# Patient Record
Sex: Female | Born: 1955 | Race: Black or African American | Hispanic: No | Marital: Single
Health system: Southern US, Community
[De-identification: ages and names within clinical notes are randomized; demographics above are authoritative.]

---

## 1998-01-08 ENCOUNTER — Ambulatory Visit (HOSPITAL_COMMUNITY): Admission: RE | Admit: 1998-01-08 | Discharge: 1998-01-08 | Payer: Self-pay | Admitting: Urology

## 1998-04-05 ENCOUNTER — Ambulatory Visit (HOSPITAL_COMMUNITY): Admission: RE | Admit: 1998-04-05 | Discharge: 1998-04-05 | Payer: Self-pay | Admitting: Urology

## 1998-04-05 ENCOUNTER — Encounter: Payer: Self-pay | Admitting: Urology

## 1999-01-08 ENCOUNTER — Other Ambulatory Visit: Admission: RE | Admit: 1999-01-08 | Discharge: 1999-01-08 | Payer: Self-pay | Admitting: *Deleted

## 1999-07-22 ENCOUNTER — Encounter: Payer: Self-pay | Admitting: Urology

## 1999-07-22 ENCOUNTER — Encounter: Admission: RE | Admit: 1999-07-22 | Discharge: 1999-07-22 | Payer: Self-pay | Admitting: Urology

## 1999-07-29 ENCOUNTER — Encounter: Payer: Self-pay | Admitting: Urology

## 1999-07-29 ENCOUNTER — Ambulatory Visit (HOSPITAL_COMMUNITY): Admission: RE | Admit: 1999-07-29 | Discharge: 1999-07-29 | Payer: Self-pay | Admitting: Urology

## 1999-11-08 ENCOUNTER — Ambulatory Visit (HOSPITAL_BASED_OUTPATIENT_CLINIC_OR_DEPARTMENT_OTHER): Admission: RE | Admit: 1999-11-08 | Discharge: 1999-11-08 | Payer: Self-pay | Admitting: Orthopedic Surgery

## 1999-11-08 ENCOUNTER — Encounter (INDEPENDENT_AMBULATORY_CARE_PROVIDER_SITE_OTHER): Payer: Self-pay | Admitting: *Deleted

## 2000-03-16 ENCOUNTER — Encounter: Admission: RE | Admit: 2000-03-16 | Discharge: 2000-03-16 | Payer: Self-pay | Admitting: Family Medicine

## 2000-03-16 ENCOUNTER — Encounter: Payer: Self-pay | Admitting: Family Medicine

## 2000-07-02 ENCOUNTER — Encounter: Payer: Self-pay | Admitting: Internal Medicine

## 2000-07-02 ENCOUNTER — Emergency Department (HOSPITAL_COMMUNITY): Admission: EM | Admit: 2000-07-02 | Discharge: 2000-07-03 | Payer: Self-pay | Admitting: Emergency Medicine

## 2001-04-12 ENCOUNTER — Encounter: Payer: Self-pay | Admitting: Family Medicine

## 2001-04-12 ENCOUNTER — Encounter: Admission: RE | Admit: 2001-04-12 | Discharge: 2001-04-12 | Payer: Self-pay | Admitting: Family Medicine

## 2002-04-18 ENCOUNTER — Encounter: Admission: RE | Admit: 2002-04-18 | Discharge: 2002-04-18 | Payer: Self-pay

## 2002-04-18 ENCOUNTER — Encounter: Payer: Self-pay | Admitting: Family Medicine

## 2002-11-01 ENCOUNTER — Encounter: Payer: Self-pay | Admitting: Family Medicine

## 2002-11-01 ENCOUNTER — Encounter: Admission: RE | Admit: 2002-11-01 | Discharge: 2002-11-01 | Payer: Self-pay | Admitting: Family Medicine

## 2002-11-30 ENCOUNTER — Emergency Department (HOSPITAL_COMMUNITY): Admission: EM | Admit: 2002-11-30 | Discharge: 2002-11-30 | Payer: Self-pay | Admitting: Emergency Medicine

## 2002-11-30 ENCOUNTER — Encounter: Payer: Self-pay | Admitting: Emergency Medicine

## 2003-04-25 ENCOUNTER — Ambulatory Visit (HOSPITAL_COMMUNITY): Admission: RE | Admit: 2003-04-25 | Discharge: 2003-04-25 | Payer: Self-pay | Admitting: Family Medicine

## 2003-07-12 ENCOUNTER — Emergency Department (HOSPITAL_COMMUNITY): Admission: EM | Admit: 2003-07-12 | Discharge: 2003-07-13 | Payer: Self-pay | Admitting: Emergency Medicine

## 2004-03-04 ENCOUNTER — Other Ambulatory Visit: Admission: RE | Admit: 2004-03-04 | Discharge: 2004-03-04 | Payer: Self-pay | Admitting: Family Medicine

## 2004-05-23 ENCOUNTER — Encounter: Admission: RE | Admit: 2004-05-23 | Discharge: 2004-05-23 | Payer: Self-pay | Admitting: Family Medicine

## 2005-03-13 ENCOUNTER — Encounter (INDEPENDENT_AMBULATORY_CARE_PROVIDER_SITE_OTHER): Payer: Self-pay | Admitting: *Deleted

## 2005-03-13 ENCOUNTER — Ambulatory Visit (HOSPITAL_BASED_OUTPATIENT_CLINIC_OR_DEPARTMENT_OTHER): Admission: RE | Admit: 2005-03-13 | Discharge: 2005-03-13 | Payer: Self-pay | Admitting: Obstetrics and Gynecology

## 2005-03-13 ENCOUNTER — Ambulatory Visit (HOSPITAL_COMMUNITY): Admission: RE | Admit: 2005-03-13 | Discharge: 2005-03-13 | Payer: Self-pay | Admitting: Obstetrics and Gynecology

## 2005-06-03 ENCOUNTER — Encounter: Admission: RE | Admit: 2005-06-03 | Discharge: 2005-06-03 | Payer: Self-pay | Admitting: Family Medicine

## 2005-11-05 ENCOUNTER — Other Ambulatory Visit: Admission: RE | Admit: 2005-11-05 | Discharge: 2005-11-05 | Payer: Self-pay | Admitting: Obstetrics and Gynecology

## 2006-06-22 ENCOUNTER — Encounter: Admission: RE | Admit: 2006-06-22 | Discharge: 2006-06-22 | Payer: Self-pay | Admitting: *Deleted

## 2006-11-02 ENCOUNTER — Other Ambulatory Visit: Admission: RE | Admit: 2006-11-02 | Discharge: 2006-11-02 | Payer: Self-pay | Admitting: Family Medicine

## 2007-06-28 ENCOUNTER — Encounter: Admission: RE | Admit: 2007-06-28 | Discharge: 2007-06-28 | Payer: Self-pay | Admitting: Family Medicine

## 2008-02-01 ENCOUNTER — Emergency Department (HOSPITAL_BASED_OUTPATIENT_CLINIC_OR_DEPARTMENT_OTHER): Admission: EM | Admit: 2008-02-01 | Discharge: 2008-02-01 | Payer: Self-pay | Admitting: Emergency Medicine

## 2008-02-02 ENCOUNTER — Ambulatory Visit (HOSPITAL_BASED_OUTPATIENT_CLINIC_OR_DEPARTMENT_OTHER): Admission: RE | Admit: 2008-02-02 | Discharge: 2008-02-02 | Payer: Self-pay | Admitting: Emergency Medicine

## 2008-04-07 ENCOUNTER — Encounter (INDEPENDENT_AMBULATORY_CARE_PROVIDER_SITE_OTHER): Payer: Self-pay | Admitting: Orthopedic Surgery

## 2008-04-07 ENCOUNTER — Ambulatory Visit (HOSPITAL_BASED_OUTPATIENT_CLINIC_OR_DEPARTMENT_OTHER): Admission: RE | Admit: 2008-04-07 | Discharge: 2008-04-07 | Payer: Self-pay | Admitting: Orthopedic Surgery

## 2008-07-03 ENCOUNTER — Other Ambulatory Visit: Admission: RE | Admit: 2008-07-03 | Discharge: 2008-07-03 | Payer: Self-pay | Admitting: Family Medicine

## 2008-07-17 ENCOUNTER — Encounter: Admission: RE | Admit: 2008-07-17 | Discharge: 2008-07-17 | Payer: Self-pay | Admitting: Family Medicine

## 2009-01-27 ENCOUNTER — Ambulatory Visit: Payer: Self-pay | Admitting: Radiology

## 2009-01-27 ENCOUNTER — Emergency Department (HOSPITAL_BASED_OUTPATIENT_CLINIC_OR_DEPARTMENT_OTHER): Admission: EM | Admit: 2009-01-27 | Discharge: 2009-01-27 | Payer: Self-pay | Admitting: Emergency Medicine

## 2009-07-01 ENCOUNTER — Emergency Department (HOSPITAL_COMMUNITY): Admission: EM | Admit: 2009-07-01 | Discharge: 2009-07-01 | Payer: Self-pay | Admitting: Family Medicine

## 2009-07-01 ENCOUNTER — Emergency Department (HOSPITAL_COMMUNITY): Admission: EM | Admit: 2009-07-01 | Discharge: 2009-07-02 | Payer: Self-pay | Admitting: Emergency Medicine

## 2009-07-09 ENCOUNTER — Other Ambulatory Visit: Admission: RE | Admit: 2009-07-09 | Discharge: 2009-07-09 | Payer: Self-pay | Admitting: Family Medicine

## 2010-02-26 ENCOUNTER — Encounter: Admission: RE | Admit: 2010-02-26 | Discharge: 2010-02-26 | Payer: Self-pay | Admitting: Family Medicine

## 2010-06-16 ENCOUNTER — Encounter: Payer: Self-pay | Admitting: Family Medicine

## 2010-08-07 ENCOUNTER — Other Ambulatory Visit: Payer: Self-pay | Admitting: Physician Assistant

## 2010-08-07 ENCOUNTER — Other Ambulatory Visit (HOSPITAL_COMMUNITY)
Admission: RE | Admit: 2010-08-07 | Discharge: 2010-08-07 | Disposition: A | Discharge status: Home / Self Care | Payer: Federal, State, Local not specified - PPO | Source: Ambulatory Visit | Attending: Family Medicine | Admitting: Family Medicine

## 2010-08-07 DIAGNOSIS — Z01419 Encounter for gynecological examination (general) (routine) without abnormal findings: Secondary | ICD-10-CM | POA: Insufficient documentation

## 2010-08-14 LAB — URINALYSIS, ROUTINE W REFLEX MICROSCOPIC
Bilirubin Urine: NEGATIVE
Ketones, ur: NEGATIVE mg/dL
Nitrite: NEGATIVE
Specific Gravity, Urine: 1.018 (ref 1.005–1.030)
Urobilinogen, UA: 1 mg/dL (ref 0.0–1.0)

## 2010-08-14 LAB — URINE CULTURE
Colony Count: NO GROWTH
Culture: NO GROWTH

## 2010-08-14 LAB — POCT I-STAT, CHEM 8
BUN: 14 mg/dL (ref 6–23)
Chloride: 111 mEq/L (ref 96–112)
Sodium: 144 mEq/L (ref 135–145)
TCO2: 28 mmol/L (ref 0–100)

## 2010-08-14 LAB — DIFFERENTIAL
Basophils Absolute: 0 10*3/uL (ref 0.0–0.1)
Lymphocytes Relative: 32 % (ref 12–46)
Neutro Abs: 3.2 10*3/uL (ref 1.7–7.7)

## 2010-08-14 LAB — CBC
Platelets: 154 10*3/uL (ref 150–400)
RDW: 12.9 % (ref 11.5–15.5)

## 2010-10-08 NOTE — Op Note (Signed)
[name], [name]              ACCOUNT NO.:  [id number]   MEDICAL RECORD NO.:  [id number]          PATIENT TYPE:  AMB   LOCATION:  DSC                          FACILITY:  [facility]   PHYSICIAN:  Cindee Salt, M.D.       DATE OF BIRTH:  1955/06/21   DATE OF PROCEDURE:  04/07/2008  DATE OF DISCHARGE:                               OPERATIVE REPORT   PREOPERATIVE DIAGNOSIS:  Stenosing tenosynovitis, left middle finger.   POSTOPERATIVE DIAGNOSIS:  Stenosing tenosynovitis, left middle finger.   OPERATION:  Tenosynovectomy, left middle finger flexor tendons.   SURGEON:  Cindee Salt, MD   ASSISTANT:  Carolyne Fiscal RN   ANESTHESIA:  General.   ANESTHESIOLOGIST:  Janetta Hora. Frederick, MD   HISTORY:  The patient is a 55 year old female with a long history of  swelling of the volar aspect of her left middle finger.  This has not  responded to conservative treatment.  Laboratory work for arthritis and  infection have all been negative.  MRI reveals a tenosynovitis.  She has  no family history of arthritis.  She is admitted now for tenosynovectomy  for diagnosis, cultures, tissue specimen.  She is aware of risks and  complications including infection; recurrence injury to arteries,  nerves, tendons; incomplete relief of symptoms; dystrophy; and  continuation of her swelling.  She has elected to proceed.  In the  preoperative area, the patient is seen.  The extremity marked by both  the patient and surgeon.   PROCEDURE:  The patient was brought to the operating room where a  general anesthetic was carried out without difficulty under the  direction of Dr. Merlyn Albert.  She was prepped using DuraPrep, supine position,  left arm free.  The limb was exsanguinated with an Esmarch bandage after  time-out.  The tourniquet was placed high in the forearm and inflated to  250 mmHg.  A volar Brunner incision was made, carried down through  subcutaneous tissue.  Bleeders were electrocauterized.  The digital  arteries  and nerves were identified and protected.  The flexor sheath  was identified.  Significant swelling was present along the margins with  extrusion of tenosynovial tissue with large amounts of granulation-type  tissue with large fronds of vascular tissue.  Cultures were taken for  both aerobic and anaerobic, fungal, TB cultures, and acid-fast.  The  tenosynovial tissue was then removed over the entire flexor sheath up to  the A4 pulley.  Specimens were sent to pathology.  The wound was  copiously irrigated with saline.  The skin was then closed with  interrupted 5-0 Vicryl Rapide sutures.  A block was given to the median  nerve and to the  common digital nerves.  On deflation of the tourniquet, all fingers were  immediately pink.  She was taken to the recovery room for observation in  satisfactory condition.  She will be discharged home to return to the  [facility] [place] in 1 week on Vicodin.           ______________________________  Cindee Salt, M.D.     GK/MEDQ  D:  04/07/2008  T:  04/08/2008  Job:  [id number]

## 2010-10-11 NOTE — Op Note (Signed)
[place]  Patient:    Theresa [name], Theresa [name]                     MRN: [id number] Proc. Date: 11/08/99 Adm. Date:  [id number] Disc. Date: [id number] Attending:  Ronne Binning CC:         Nicki Reaper, M.D. x 2                           Operative Report  PREOPERATIVE DIAGNOSIS:  Mass of right index finger.  POSTOPERATIVE DIAGNOSIS:  Mass of right index finger.  OPERATION:  Excision of mass, right index finger.  SURGEON:  Nicki Reaper, M.D.  ANESTHESIA:  Axillary block.  ANESTHESIOLOGIST:  Guadalupe Maple, M.D.  INDICATIONS:  The patient is a 55 year old female with a history of an enlarged mass over the ulnar aspect on the volar side of her right index finger distal phalangeal joint.  DESCRIPTION OF PROCEDURE:  The patient was brought to the operating room where an axillary block was carried out without difficulty.  She was prepped and draped using Betadine scrubbing solution with the right arm free.  The limb was exsanguinated with an Esmarch bandage.  The tourniquet was placed high on the arm and was inflated to 250 mmHg.  A hockey stick-type incision was made on the ulnar aspect ________ and carried down on the volar side, and carried down through subcutaneous tissue.  Bleeders were electrocauterized.  With blunt and sharp dissection, the mass was encountered.  This was a multiloculated yellow-tan tumor.  The digital nerve and artery were identified and protected.  With blunt and sharp dissection, the entire mass was removed. This was followed down into the joint and no further lesions were found within the joint.  The wound was irrigated and the skin was closed with interrupted 5-0 nylon suture.  A sterile compressive dressing and splint was applied to the finger.  The patient tolerated the procedure well and was taken to the recovery room for observation in satisfactory condition.  She is discharged home to return to the [facility] [place] in one weeks time on Vicodin and Keflex. DD:  11/08/99 TD:  11/12/99 Job: [id number]

## 2010-10-11 NOTE — Op Note (Signed)
Theresa [name], BENGE              ACCOUNT NO.:  [id number]   MEDICAL RECORD NO.:  [id number]          PATIENT TYPE:  AMB   LOCATION:  NESC                         FACILITY:  [facility]   PHYSICIAN:  Daniel L. Gottsegen, M.D.DATE OF BIRTH:  08-13-1955   DATE OF PROCEDURE:  03/13/2005  DATE OF DISCHARGE:                                 OPERATIVE REPORT   PREOPERATIVE DIAGNOSIS:  Dysfunctional uterine bleeding, fibroids including  submucous fibroids.   POSTOPERATIVE DIAGNOSIS:  Dysfunctional uterine bleeding, fibroids including  submucous fibroids.   OPERATIONS:  Hysteroscopy with resection of fibroids.   SURGEON:  Dr. Eda Paschal.   INDICATIONS:  The patient is a 55 year old female who has continued to have  menometrorrhagia. She has multiple intramural myomas, but on saline fusion  histogram, she has two or three within the cavity. The patient continues to  bleed in spite of oral progestins. She was given depot Lupron to prepare her  endometrium, and she now enters the hospital for hysteroscopic resection.  Her hemoglobin has dropped to 10 grams because of all of bleeding.   FINDINGS:  External and vaginal is normal. Cervix is clean. Uterus is  enlarged by fibroids to about 12 to 14 weeks' size. There is some uterine  descensus. Adnexa are palpably normal. At the time hysteroscopy, the patient  did have several submucous myomas, several at the top of the fundus, but  there was also one on the right lateral fundal wall which was partially in  the cavity, partially in the wall, which could also be completely resected.  Each of these was in the neighborhood of 2-3 cm. After these had been  removed, the patient had normal endometrial cavity as well as a normal  endocervical cavity.   PROCEDURE:  After adequate general anesthesia, the patient was placed in  dorsal supine position, prepped and draped usual sterile manner. The cervix  was dilated to a #33 Pratt dilator and then the  hysteroscopic resectoscope  was introduced. It had a 90 degree wire loop with settings of 70 coag and  110 cutting blend one. Three percent sorbitol was used to expand the  intrauterine cavity, and a camera was used for magnification. Initially, it  was difficult to see because there was some clot in her uterus because of  the active bleeding. A suction curettage was done, and then the view was  much clearer. All the myomas seen within the cavity could be resected. There  were down systematically and slowly, including the one in the right fundal  wall. The specimen was extremely full when it was sent. All bleeding was  well-controlled. At one point in the procedure, the fluid deficit went to  1,000 cc. However, we felt that about half was on the floor. Electrolytes  and glucose were checked, and there were all normal, and therefore the  procedure was continued. Slightly after that, the fluid deficit actually  dropped to 700 cc, suggesting that the system had not been completely  working, and then resection was continued. Total operating time was  approximately 1 hour. At the termination of procedure, there was  absolutely  no bleeding noted. Blood loss was  less than 50 cc, and fluid deficit was counted at approximately 500 cc. The  patient left the operating room in satisfactory condition. She will be given  another injection of depot Lupron 3.75 mg IM and will go home on Chromagen  for iron replacement.      Daniel L. Eda Paschal, M.D.  Electronically Signed     DLG/MEDQ  D:  03/13/2005  T:  03/13/2005  Job:  [id number]

## 2011-02-07 ENCOUNTER — Other Ambulatory Visit: Payer: Self-pay | Admitting: Family Medicine

## 2011-02-07 DIAGNOSIS — Z1231 Encounter for screening mammogram for malignant neoplasm of breast: Secondary | ICD-10-CM

## 2011-02-25 LAB — ANAEROBIC CULTURE: Gram Stain: NONE SEEN

## 2011-02-25 LAB — WOUND CULTURE
Culture: NO GROWTH
Gram Stain: NONE SEEN

## 2011-02-25 LAB — POCT HEMOGLOBIN-HEMACUE: Hemoglobin: 15.4 g/dL — ABNORMAL HIGH (ref 12.0–15.0)

## 2011-02-25 LAB — AFB CULTURE WITH SMEAR (NOT AT ARMC)

## 2011-02-26 LAB — DIFFERENTIAL
Eosinophils Absolute: 0.1
Lymphocytes Relative: 31
Lymphs Abs: 2
Neutrophils Relative %: 57

## 2011-02-26 LAB — CBC
Platelets: 137 — ABNORMAL LOW
WBC: 6.5

## 2011-02-26 LAB — BASIC METABOLIC PANEL
BUN: 15
Creatinine, Ser: 1.1
GFR calc non Af Amer: 52 — ABNORMAL LOW

## 2011-02-26 LAB — D-DIMER, QUANTITATIVE: D-Dimer, Quant: 0.54 — ABNORMAL HIGH

## 2011-03-03 ENCOUNTER — Ambulatory Visit: Payer: Federal, State, Local not specified - PPO

## 2011-03-04 ENCOUNTER — Ambulatory Visit
Admission: RE | Admit: 2011-03-04 | Discharge: 2011-03-04 | Disposition: A | Discharge status: Home / Self Care | Payer: Federal, State, Local not specified - PPO | Source: Ambulatory Visit | Attending: Family Medicine | Admitting: Family Medicine

## 2011-03-04 DIAGNOSIS — Z1231 Encounter for screening mammogram for malignant neoplasm of breast: Secondary | ICD-10-CM

## 2011-08-07 ENCOUNTER — Other Ambulatory Visit (HOSPITAL_COMMUNITY)
Admission: RE | Admit: 2011-08-07 | Discharge: 2011-08-07 | Disposition: A | Discharge status: Home / Self Care | Payer: Federal, State, Local not specified - PPO | Source: Ambulatory Visit | Attending: Family Medicine | Admitting: Family Medicine

## 2011-08-07 ENCOUNTER — Other Ambulatory Visit: Payer: Self-pay | Admitting: Physician Assistant

## 2011-08-07 DIAGNOSIS — Z124 Encounter for screening for malignant neoplasm of cervix: Secondary | ICD-10-CM | POA: Insufficient documentation

## 2012-01-27 ENCOUNTER — Other Ambulatory Visit: Payer: Self-pay | Admitting: Family Medicine

## 2012-01-27 ENCOUNTER — Other Ambulatory Visit (HOSPITAL_COMMUNITY): Payer: Self-pay | Admitting: Family Medicine

## 2012-01-27 DIAGNOSIS — Z1231 Encounter for screening mammogram for malignant neoplasm of breast: Secondary | ICD-10-CM

## 2012-03-08 ENCOUNTER — Ambulatory Visit
Admission: RE | Admit: 2012-03-08 | Discharge: 2012-03-08 | Disposition: A | Discharge status: Home / Self Care | Payer: Federal, State, Local not specified - PPO | Source: Ambulatory Visit | Attending: Family Medicine | Admitting: Family Medicine

## 2012-03-08 DIAGNOSIS — Z1231 Encounter for screening mammogram for malignant neoplasm of breast: Secondary | ICD-10-CM

## 2012-03-09 ENCOUNTER — Ambulatory Visit: Payer: Federal, State, Local not specified - PPO

## 2013-01-05 ENCOUNTER — Other Ambulatory Visit: Payer: Self-pay

## 2013-01-05 DIAGNOSIS — Z1231 Encounter for screening mammogram for malignant neoplasm of breast: Secondary | ICD-10-CM

## 2013-01-27 ENCOUNTER — Other Ambulatory Visit: Payer: Self-pay | Admitting: Physician Assistant

## 2013-01-27 ENCOUNTER — Other Ambulatory Visit (HOSPITAL_COMMUNITY)
Admission: RE | Admit: 2013-01-27 | Discharge: 2013-01-27 | Disposition: A | Discharge status: Home / Self Care | Payer: Federal, State, Local not specified - PPO | Source: Ambulatory Visit | Attending: Family Medicine | Admitting: Family Medicine

## 2013-01-27 DIAGNOSIS — Z1151 Encounter for screening for human papillomavirus (HPV): Secondary | ICD-10-CM | POA: Insufficient documentation

## 2013-01-27 DIAGNOSIS — Z124 Encounter for screening for malignant neoplasm of cervix: Secondary | ICD-10-CM | POA: Insufficient documentation

## 2013-03-02 ENCOUNTER — Other Ambulatory Visit: Payer: Self-pay | Admitting: Obstetrics and Gynecology

## 2013-03-09 ENCOUNTER — Ambulatory Visit
Admission: RE | Admit: 2013-03-09 | Discharge: 2013-03-09 | Disposition: A | Discharge status: Home / Self Care | Payer: Federal, State, Local not specified - PPO | Source: Ambulatory Visit

## 2013-03-09 DIAGNOSIS — Z1231 Encounter for screening mammogram for malignant neoplasm of breast: Secondary | ICD-10-CM

## 2014-01-17 ENCOUNTER — Other Ambulatory Visit: Payer: Self-pay

## 2014-01-17 DIAGNOSIS — Z1231 Encounter for screening mammogram for malignant neoplasm of breast: Secondary | ICD-10-CM

## 2014-01-31 ENCOUNTER — Other Ambulatory Visit (HOSPITAL_COMMUNITY)
Admission: RE | Admit: 2014-01-31 | Discharge: 2014-01-31 | Disposition: A | Discharge status: Home / Self Care | Payer: Federal, State, Local not specified - PPO | Source: Ambulatory Visit | Attending: Family Medicine | Admitting: Family Medicine

## 2014-01-31 ENCOUNTER — Other Ambulatory Visit: Payer: Self-pay | Admitting: Physician Assistant

## 2014-01-31 DIAGNOSIS — Z1151 Encounter for screening for human papillomavirus (HPV): Secondary | ICD-10-CM | POA: Insufficient documentation

## 2014-01-31 DIAGNOSIS — R87810 Cervical high risk human papillomavirus (HPV) DNA test positive: Secondary | ICD-10-CM | POA: Insufficient documentation

## 2014-01-31 DIAGNOSIS — Z124 Encounter for screening for malignant neoplasm of cervix: Secondary | ICD-10-CM | POA: Insufficient documentation

## 2014-02-02 LAB — CYTOLOGY - PAP

## 2014-03-13 ENCOUNTER — Ambulatory Visit
Admission: RE | Admit: 2014-03-13 | Discharge: 2014-03-13 | Disposition: A | Discharge status: Home / Self Care | Payer: Federal, State, Local not specified - PPO | Source: Ambulatory Visit

## 2014-03-13 ENCOUNTER — Inpatient Hospital Stay: Admission: RE | Admit: 2014-03-13 | Payer: Federal, State, Local not specified - PPO | Source: Ambulatory Visit

## 2014-03-13 DIAGNOSIS — Z1231 Encounter for screening mammogram for malignant neoplasm of breast: Secondary | ICD-10-CM

## 2014-03-16 ENCOUNTER — Other Ambulatory Visit: Payer: Self-pay | Admitting: Obstetrics and Gynecology

## 2014-10-09 ENCOUNTER — Ambulatory Visit (INDEPENDENT_AMBULATORY_CARE_PROVIDER_SITE_OTHER): Payer: Federal, State, Local not specified - PPO | Admitting: Podiatry

## 2014-10-09 ENCOUNTER — Ambulatory Visit (INDEPENDENT_AMBULATORY_CARE_PROVIDER_SITE_OTHER): Payer: Federal, State, Local not specified - PPO

## 2014-10-09 DIAGNOSIS — L84 Corns and callosities: Secondary | ICD-10-CM | POA: Diagnosis not present

## 2014-10-09 DIAGNOSIS — M779 Enthesopathy, unspecified: Secondary | ICD-10-CM | POA: Diagnosis not present

## 2014-10-09 DIAGNOSIS — M2041 Other hammer toe(s) (acquired), right foot: Secondary | ICD-10-CM

## 2014-10-09 MED ORDER — TRIAMCINOLONE ACETONIDE 10 MG/ML IJ SUSP
10.0000 mg | Freq: Once | INTRAMUSCULAR | Status: AC
  Administered 2014-10-09: 10 mg

## 2014-10-09 NOTE — Progress Notes (Signed)
   Subjective:    Patient ID: Theresa [name], female    DOB: 1955-08-16, 59 y.o.   MRN: [id number]  HPI  I had surgery on my toe many years ago by Dr Leeanne Deeduchman my toe never laid down but now I have a painful corn on the top of my right 4th toe     Review of Systems     Objective:   Physical Exam        Assessment & Plan:

## 2014-10-10 NOTE — Progress Notes (Signed)
Subjective:     Patient ID: Theresa [name], female   DOB: Jul 04, 1955, 59 y.o.   MRN: [id number]  HPI patient presents stating I'm having a painful fourth toe right and it's got fluid in it and I am having trouble wearing shoe gear. States it's been bothering me for the last few months   Review of Systems  All other systems reviewed and are negative.      Objective:   Physical Exam  Constitutional: She is oriented to person, place, and time.  Cardiovascular: Intact distal pulses.   Musculoskeletal: Normal range of motion.  Neurological: She is oriented to person, place, and time.  Skin: Skin is warm.  Nursing note and vitals reviewed.  neurovascular status intact muscle strength adequate with range of motion subtalar midtarsal joint within normal limits. Patient's noted to have good digital flow and is noted to be well well oriented 3 area patient has an elevated fourth toe with keratotic lesion at the head of the proximal phalanx with fluid buildup noted and pain when palpated. Previous scar is noted distal fourth toe     Assessment:     Hammertoe deformity fourth digit right with inflammatory capsular inflammation of the toe at the interphalangeal joint with keratotic lesion formation    Plan:     H&P and x-rays reviewed. Careful interphalangeal injection administered 2 mg Dexon the some Kenalog 2 mill grams Xylocaine and deep debridement of lesion along with padding accomplished. Patient was given instructions on wider-type shoes and reappoint to recheck

## 2014-11-07 ENCOUNTER — Ambulatory Visit: Payer: Federal, State, Local not specified - PPO | Admitting: Podiatry

## 2014-11-13 ENCOUNTER — Ambulatory Visit: Payer: Federal, State, Local not specified - PPO | Admitting: Podiatry

## 2015-02-01 ENCOUNTER — Encounter: Payer: Self-pay | Admitting: Podiatry

## 2015-02-01 ENCOUNTER — Ambulatory Visit (INDEPENDENT_AMBULATORY_CARE_PROVIDER_SITE_OTHER): Payer: Federal, State, Local not specified - PPO | Admitting: Podiatry

## 2015-02-01 VITALS — BP 110/66 | HR 63 | Resp 16

## 2015-02-01 DIAGNOSIS — M2041 Other hammer toe(s) (acquired), right foot: Secondary | ICD-10-CM | POA: Diagnosis not present

## 2015-02-01 NOTE — Patient Instructions (Signed)
Pre-Operative Instructions  Congratulations, you have decided to take an important step to improving your quality of life.  You can be assured that the doctors of [facility] will be with you every step of the way.  1. Plan to be at the surgery center/hospital at least 1 (one) hour prior to your scheduled time unless otherwise directed by the surgical center/hospital staff.  You must have a responsible adult accompany you, remain during the surgery and drive you home.  Make sure you have directions to the surgical center/hospital and know how to get there on time. 2. For hospital based surgery you will need to obtain a history and physical form from your family physician within 1 month prior to the date of surgery- we will give you a form for you primary physician.  3. We make every effort to accommodate the date you request for surgery.  There are however, times where surgery dates or times have to be moved.  We will contact you as soon as possible if a change in schedule is required.   4. No Aspirin/Ibuprofen for one week before surgery.  If you are on aspirin, any non-steroidal anti-inflammatory medications (Mobic, Aleve, Ibuprofen) you should stop taking it 7 days prior to your surgery.  You make take Tylenol  For pain prior to surgery.  5. Medications- If you are taking daily heart and blood pressure medications, seizure, reflux, allergy, asthma, anxiety, pain or diabetes medications, make sure the surgery center/hospital is aware before the day of surgery so they may notify you which medications to take or avoid the day of surgery. 6. No food or drink after midnight the night before surgery unless directed otherwise by surgical center/hospital staff. 7. No alcoholic beverages 24 hours prior to surgery.  No smoking 24 hours prior to or 24 hours after surgery. 8. Wear loose pants or shorts- loose enough to fit over bandages, boots, and casts. 9. No slip on shoes, sneakers are best. 10. Bring  your boot with you to the surgery center/hospital.  Also bring crutches or a walker if your physician has prescribed it for you.  If you do not have this equipment, it will be provided for you after surgery. 11. If you have not been contracted by the surgery center/hospital by the day before your surgery, call to confirm the date and time of your surgery. 12. Leave-time from work may vary depending on the type of surgery you have.  Appropriate arrangements should be made prior to surgery with your employer. 13. Prescriptions will be provided immediately following surgery by your doctor.  Have these filled as soon as possible after surgery and take the medication as directed. 14. Remove nail polish on the operative foot. 15. Wash the night before surgery.  The night before surgery wash the foot and leg well with the antibacterial soap provided and water paying special attention to beneath the toenails and in between the toes.  Rinse thoroughly with water and dry well with a towel.  Perform this wash unless told not to do so by your physician.  Enclosed: 1 Ice pack (please put in freezer the night before surgery)   1 Hibiclens skin cleaner   Pre-op Instructions  If you have any questions regarding the instructions, do not hesitate to call our office.  [place]: [address] [place], [place] [address] [phone]  [place]: [address] [place], [place] [address] [phone]  [place]: [address]  [place], [place] [address] [phone]  Dr. Richard   Tuchman DPM, Dr. Norman Regal DPM Dr. Richard Sikora DPM, Dr. M. Todd Hyatt DPM, Dr. Kathryn Egerton DPM 

## 2015-02-02 ENCOUNTER — Telehealth: Payer: Self-pay | Admitting: *Deleted

## 2015-02-02 NOTE — Progress Notes (Signed)
Subjective:     Patient ID: KLARISSA MCILVAIN, female   DOB: 08/20/1955, 59 y.o.   MRN: [id number]  HPI patient states this corn on my toe is still really bothering me and it did not respond to medication trimming padding or shoe gear changes and also the one on my fifth toe is bothersome and makes it hard to wear shoe gear comfortably. I'm having trouble working or exercising due to discomfort   Review of Systems     Objective:   Physical Exam Neurovascular status found to be intact with muscle strength adequate range of motion within normal limits. Patient's found have good digital flow and is noted to have keratotic lesion dorsum of the interphalangeal joint third toe right and the head of proximal phalanx fifth toe right with elevation of the toe and pain when palpated    Assessment:     Hammertoe deformity digits 35 right foot with elevation of the third toe    Plan:     Reviewed condition and x-rays with patient indicating enlargement of the phalanx and regrowth of bone. Patient wants to have these fixed and I did recommend surgical intervention and I explained procedure and risk and reviewed alternative treatments and also complications. Patient wants surgery and is scheduled for outpatient surgery and signs consent form after review and also understands recovery can take up to 6 months. Preoperative instructions were dispensed and she is encouraged to call with any questions prior to procedure

## 2015-02-02 NOTE — Telephone Encounter (Signed)
I left patient a message that we will reschedule surgery to 03/06/2015.  Call on Monday if you have any further questions.

## 2015-02-02 NOTE — Telephone Encounter (Signed)
"  I'm scheduled for surgery on October 4th.  I was in the office yesterday.  When I got back to work, someone has that week off.  If I can do it on October 11.  Call me and let me know please.  You can leave me a message on my cell."

## 2015-02-02 NOTE — Telephone Encounter (Signed)
"  I was calling because I need to change my surgery.  I got to work and there's someone off that week.  Thank you."

## 2015-02-07 ENCOUNTER — Other Ambulatory Visit: Payer: Self-pay | Admitting: Physician Assistant

## 2015-02-07 ENCOUNTER — Other Ambulatory Visit (HOSPITAL_COMMUNITY)
Admission: RE | Admit: 2015-02-07 | Discharge: 2015-02-07 | Disposition: A | Discharge status: Home / Self Care | Payer: Federal, State, Local not specified - PPO | Source: Ambulatory Visit | Attending: Family Medicine | Admitting: Family Medicine

## 2015-02-07 DIAGNOSIS — Z124 Encounter for screening for malignant neoplasm of cervix: Secondary | ICD-10-CM | POA: Insufficient documentation

## 2015-02-12 ENCOUNTER — Other Ambulatory Visit: Payer: Self-pay

## 2015-02-12 DIAGNOSIS — Z1231 Encounter for screening mammogram for malignant neoplasm of breast: Secondary | ICD-10-CM

## 2015-02-12 LAB — CYTOLOGY - PAP

## 2015-02-26 ENCOUNTER — Telehealth: Payer: Self-pay | Admitting: *Deleted

## 2015-02-26 NOTE — Telephone Encounter (Signed)
I left a message that surgical center will call a day or two prior to procedure date.  They will give you the arrival time.  If you have questions for them, you can reach them at [phone].

## 2015-02-26 NOTE — Telephone Encounter (Signed)
"  I was calling to see what time my surgery is for October 11.  Just leave me a message on my cell.  Thank you."

## 2015-03-06 ENCOUNTER — Telehealth: Payer: Self-pay | Admitting: *Deleted

## 2015-03-06 ENCOUNTER — Encounter: Payer: Self-pay | Admitting: Podiatry

## 2015-03-06 DIAGNOSIS — M2041 Other hammer toe(s) (acquired), right foot: Secondary | ICD-10-CM

## 2015-03-06 NOTE — Telephone Encounter (Signed)
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 I told her rather than try toe stuff her surgery dressing, in the toe cap for work.  Pt later came in to the office and was fitted with the toe cap, in a slightly larger better fitting surgical shoe.  Pt states she will adhere the toe cap to her surgical shoe prior to going back to work on Monday.

## 2015-03-15 ENCOUNTER — Ambulatory Visit
Admission: RE | Admit: 2015-03-15 | Discharge: 2015-03-15 | Disposition: A | Discharge status: Home / Self Care | Payer: Federal, State, Local not specified - PPO | Source: Ambulatory Visit

## 2015-03-15 DIAGNOSIS — Z1231 Encounter for screening mammogram for malignant neoplasm of breast: Secondary | ICD-10-CM

## 2015-03-16 ENCOUNTER — Ambulatory Visit (INDEPENDENT_AMBULATORY_CARE_PROVIDER_SITE_OTHER): Payer: Federal, State, Local not specified - PPO | Admitting: Podiatry

## 2015-03-16 ENCOUNTER — Encounter: Payer: Self-pay | Admitting: Podiatry

## 2015-03-16 ENCOUNTER — Other Ambulatory Visit: Payer: Self-pay

## 2015-03-16 ENCOUNTER — Ambulatory Visit: Payer: Federal, State, Local not specified - PPO

## 2015-03-16 VITALS — BP 106/65 | HR 73 | Resp 16

## 2015-03-16 DIAGNOSIS — M2041 Other hammer toe(s) (acquired), right foot: Secondary | ICD-10-CM

## 2015-03-16 DIAGNOSIS — Z9889 Other specified postprocedural states: Secondary | ICD-10-CM

## 2015-03-22 NOTE — Progress Notes (Signed)
Patient ID: Theresa [name], female   DOB: 02/07/1956, 59 y.o.   MRN: [id number]  Subjective: Patient presents the office they for follow-up evaluation status post hammertoe repair of the third and fifth digits of the right foot. She states that she is feeling good and she is not having any pain.she's remain surgical shoe. She denies any systemic complaints such as fevers, chills, nausea, vomiting. No calf pain, chest pain, soreness of breath.  Objective: AAO 3, NAD  DP/PT pulses are palpable, CRT less than 3 seconds Protective sensation appears to be intact with Dorann OuSimms Weinstein monofilament Incisions are well coapted without any evidence of dehiscence and sutures intact. There is slight movement across the incision site the sutures are not ready to be removed at this time. There is minimal edema around the area without any associated erythema or increase in warmth. There is no tenderness palpation on the surgical site. No other areas of tenderness to bilateral lower extremities no other areas of edema, erythema, increased warmth. There is no pain with calf compression swelling, warmth, erythema.  Assessment: 59 year old female status post right third and fifth digit hammertoe correction, doing well  Plan: -Treatment options discussed including all alternatives, risks, and complications -X-rays were obtained and reviewed with the patient.  - Antibiotic ointment is placed over the incisions followed by dry so dressing. - Continue a surgical shoe. - Ice and elevation. -Pain medication as needed. -Follow-up as scheduled for suture removal or sooner if any problems arise. In the meantime, encouraged to call the office with any questions, concerns, change in symptoms.   Ovid CurdMatthew Nilza Eaker, DPM

## 2015-03-26 ENCOUNTER — Encounter: Payer: Self-pay | Admitting: Podiatry

## 2015-03-26 ENCOUNTER — Ambulatory Visit: Payer: Federal, State, Local not specified - PPO | Admitting: Podiatry

## 2015-03-26 ENCOUNTER — Ambulatory Visit (INDEPENDENT_AMBULATORY_CARE_PROVIDER_SITE_OTHER): Payer: Federal, State, Local not specified - PPO

## 2015-03-26 VITALS — BP 102/63 | HR 73 | Resp 16

## 2015-03-26 DIAGNOSIS — Z9889 Other specified postprocedural states: Secondary | ICD-10-CM

## 2015-03-26 DIAGNOSIS — M2041 Other hammer toe(s) (acquired), right foot: Secondary | ICD-10-CM | POA: Diagnosis not present

## 2015-03-28 NOTE — Progress Notes (Signed)
Subjective:     Patient ID: Theresa [name], female   DOB: 1956/02/24, 59 y.o.   MRN: [id number]   HPI patient states it's feels good but it can ich if I'm on it for too long   Review of Systems     Objective:   Physical Exam  neurovascular status intact muscle strength adequate with third and fifth digits right doing well with wound edges well coapted and good alignment of the digits    Assessment:      doing well post arthroplasty 35 right    Plan:      remove stitches apply dressings and instructed on continued open toed shoes with gradual shoe gear usage over the next few weeks

## 2016-02-01 NOTE — Progress Notes (Signed)
DOS 10.11.2016 Hammertoe repair with removal bone 3,5 right

## 2016-02-12 ENCOUNTER — Other Ambulatory Visit: Payer: Self-pay | Admitting: Physician Assistant

## 2016-02-12 ENCOUNTER — Other Ambulatory Visit (HOSPITAL_COMMUNITY)
Admission: RE | Admit: 2016-02-12 | Discharge: 2016-02-12 | Disposition: A | Discharge status: Home / Self Care | Payer: Federal, State, Local not specified - PPO | Source: Ambulatory Visit | Attending: Physician Assistant | Admitting: Physician Assistant

## 2016-02-12 DIAGNOSIS — Z01411 Encounter for gynecological examination (general) (routine) with abnormal findings: Secondary | ICD-10-CM | POA: Diagnosis not present

## 2016-02-12 DIAGNOSIS — N183 Chronic kidney disease, stage 3 (moderate): Secondary | ICD-10-CM | POA: Diagnosis not present

## 2016-02-12 DIAGNOSIS — Z124 Encounter for screening for malignant neoplasm of cervix: Secondary | ICD-10-CM | POA: Diagnosis not present

## 2016-02-12 DIAGNOSIS — I129 Hypertensive chronic kidney disease with stage 1 through stage 4 chronic kidney disease, or unspecified chronic kidney disease: Secondary | ICD-10-CM | POA: Diagnosis not present

## 2016-02-12 DIAGNOSIS — Z0000 Encounter for general adult medical examination without abnormal findings: Secondary | ICD-10-CM | POA: Diagnosis not present

## 2016-02-12 DIAGNOSIS — Z1151 Encounter for screening for human papillomavirus (HPV): Secondary | ICD-10-CM | POA: Insufficient documentation

## 2016-02-12 DIAGNOSIS — Z1211 Encounter for screening for malignant neoplasm of colon: Secondary | ICD-10-CM | POA: Diagnosis not present

## 2016-02-12 DIAGNOSIS — E1121 Type 2 diabetes mellitus with diabetic nephropathy: Secondary | ICD-10-CM | POA: Diagnosis not present

## 2016-02-12 DIAGNOSIS — R809 Proteinuria, unspecified: Secondary | ICD-10-CM | POA: Diagnosis not present

## 2016-02-12 DIAGNOSIS — E7800 Pure hypercholesterolemia, unspecified: Secondary | ICD-10-CM | POA: Diagnosis not present

## 2016-02-14 ENCOUNTER — Other Ambulatory Visit: Payer: Self-pay | Admitting: Physician Assistant

## 2016-02-14 DIAGNOSIS — Z1231 Encounter for screening mammogram for malignant neoplasm of breast: Secondary | ICD-10-CM

## 2016-02-14 LAB — CYTOLOGY - PAP

## 2016-02-25 DIAGNOSIS — E119 Type 2 diabetes mellitus without complications: Secondary | ICD-10-CM | POA: Diagnosis not present

## 2016-02-25 DIAGNOSIS — H2513 Age-related nuclear cataract, bilateral: Secondary | ICD-10-CM | POA: Diagnosis not present

## 2016-03-18 ENCOUNTER — Ambulatory Visit
Admission: RE | Admit: 2016-03-18 | Discharge: 2016-03-18 | Disposition: A | Discharge status: Home / Self Care | Payer: Federal, State, Local not specified - PPO | Source: Ambulatory Visit | Attending: Physician Assistant | Admitting: Physician Assistant

## 2016-03-18 DIAGNOSIS — Z1231 Encounter for screening mammogram for malignant neoplasm of breast: Secondary | ICD-10-CM | POA: Diagnosis not present

## 2016-03-19 ENCOUNTER — Other Ambulatory Visit: Payer: Self-pay | Admitting: Physician Assistant

## 2016-03-19 DIAGNOSIS — R928 Other abnormal and inconclusive findings on diagnostic imaging of breast: Secondary | ICD-10-CM

## 2016-04-01 ENCOUNTER — Ambulatory Visit
Admission: RE | Admit: 2016-04-01 | Discharge: 2016-04-01 | Disposition: A | Discharge status: Home / Self Care | Payer: Federal, State, Local not specified - PPO | Source: Ambulatory Visit | Attending: Physician Assistant | Admitting: Physician Assistant

## 2016-04-01 DIAGNOSIS — N6310 Unspecified lump in the right breast, unspecified quadrant: Secondary | ICD-10-CM | POA: Diagnosis not present

## 2016-04-01 DIAGNOSIS — R928 Other abnormal and inconclusive findings on diagnostic imaging of breast: Secondary | ICD-10-CM

## 2016-05-07 DIAGNOSIS — Z1211 Encounter for screening for malignant neoplasm of colon: Secondary | ICD-10-CM | POA: Diagnosis not present

## 2016-05-21 DIAGNOSIS — M25562 Pain in left knee: Secondary | ICD-10-CM | POA: Diagnosis not present

## 2016-05-21 DIAGNOSIS — N644 Mastodynia: Secondary | ICD-10-CM | POA: Diagnosis not present

## 2016-05-21 DIAGNOSIS — R0789 Other chest pain: Secondary | ICD-10-CM | POA: Diagnosis not present

## 2016-08-12 DIAGNOSIS — E1121 Type 2 diabetes mellitus with diabetic nephropathy: Secondary | ICD-10-CM | POA: Diagnosis not present

## 2016-08-12 DIAGNOSIS — I129 Hypertensive chronic kidney disease with stage 1 through stage 4 chronic kidney disease, or unspecified chronic kidney disease: Secondary | ICD-10-CM | POA: Diagnosis not present

## 2016-08-12 DIAGNOSIS — E7800 Pure hypercholesterolemia, unspecified: Secondary | ICD-10-CM | POA: Diagnosis not present

## 2016-08-12 DIAGNOSIS — N183 Chronic kidney disease, stage 3 (moderate): Secondary | ICD-10-CM | POA: Diagnosis not present

## 2017-02-17 ENCOUNTER — Other Ambulatory Visit (HOSPITAL_COMMUNITY)
Admission: RE | Admit: 2017-02-17 | Discharge: 2017-02-17 | Disposition: A | Discharge status: Home / Self Care | Payer: Federal, State, Local not specified - PPO | Source: Ambulatory Visit | Attending: Physician Assistant | Admitting: Physician Assistant

## 2017-02-17 ENCOUNTER — Other Ambulatory Visit: Payer: Self-pay | Admitting: Physician Assistant

## 2017-02-17 DIAGNOSIS — Z7984 Long term (current) use of oral hypoglycemic drugs: Secondary | ICD-10-CM | POA: Diagnosis not present

## 2017-02-17 DIAGNOSIS — Z124 Encounter for screening for malignant neoplasm of cervix: Secondary | ICD-10-CM | POA: Diagnosis not present

## 2017-02-17 DIAGNOSIS — R87610 Atypical squamous cells of undetermined significance on cytologic smear of cervix (ASC-US): Secondary | ICD-10-CM | POA: Diagnosis not present

## 2017-02-17 DIAGNOSIS — I129 Hypertensive chronic kidney disease with stage 1 through stage 4 chronic kidney disease, or unspecified chronic kidney disease: Secondary | ICD-10-CM | POA: Diagnosis not present

## 2017-02-17 DIAGNOSIS — E1121 Type 2 diabetes mellitus with diabetic nephropathy: Secondary | ICD-10-CM | POA: Diagnosis not present

## 2017-02-17 DIAGNOSIS — N183 Chronic kidney disease, stage 3 (moderate): Secondary | ICD-10-CM | POA: Diagnosis not present

## 2017-02-17 DIAGNOSIS — Z23 Encounter for immunization: Secondary | ICD-10-CM | POA: Diagnosis not present

## 2017-02-17 DIAGNOSIS — Z0000 Encounter for general adult medical examination without abnormal findings: Secondary | ICD-10-CM | POA: Diagnosis not present

## 2017-02-17 DIAGNOSIS — E7800 Pure hypercholesterolemia, unspecified: Secondary | ICD-10-CM | POA: Diagnosis not present

## 2017-02-23 ENCOUNTER — Other Ambulatory Visit: Payer: Self-pay | Admitting: Physician Assistant

## 2017-02-23 DIAGNOSIS — N63 Unspecified lump in unspecified breast: Secondary | ICD-10-CM

## 2017-02-26 LAB — CYTOLOGY - PAP
Diagnosis: UNDETERMINED — AB
HPV (WINDOPATH): NOT DETECTED

## 2017-03-03 DIAGNOSIS — H2513 Age-related nuclear cataract, bilateral: Secondary | ICD-10-CM | POA: Diagnosis not present

## 2017-03-03 DIAGNOSIS — E119 Type 2 diabetes mellitus without complications: Secondary | ICD-10-CM | POA: Diagnosis not present

## 2017-03-19 ENCOUNTER — Ambulatory Visit
Admission: RE | Admit: 2017-03-19 | Discharge: 2017-03-19 | Disposition: A | Discharge status: Home / Self Care | Payer: Federal, State, Local not specified - PPO | Source: Ambulatory Visit | Attending: Physician Assistant | Admitting: Physician Assistant

## 2017-03-19 ENCOUNTER — Ambulatory Visit: Payer: Federal, State, Local not specified - PPO

## 2017-03-19 DIAGNOSIS — R928 Other abnormal and inconclusive findings on diagnostic imaging of breast: Secondary | ICD-10-CM | POA: Diagnosis not present

## 2017-03-19 DIAGNOSIS — N63 Unspecified lump in unspecified breast: Secondary | ICD-10-CM

## 2017-03-23 DIAGNOSIS — K080 Exfoliation of teeth due to systemic causes: Secondary | ICD-10-CM | POA: Diagnosis not present

## 2017-04-01 DIAGNOSIS — K080 Exfoliation of teeth due to systemic causes: Secondary | ICD-10-CM | POA: Diagnosis not present

## 2017-04-07 DIAGNOSIS — K5730 Diverticulosis of large intestine without perforation or abscess without bleeding: Secondary | ICD-10-CM | POA: Diagnosis not present

## 2017-04-07 DIAGNOSIS — Z86010 Personal history of colonic polyps: Secondary | ICD-10-CM | POA: Diagnosis not present

## 2017-04-07 DIAGNOSIS — D126 Benign neoplasm of colon, unspecified: Secondary | ICD-10-CM | POA: Diagnosis not present

## 2017-04-09 DIAGNOSIS — D126 Benign neoplasm of colon, unspecified: Secondary | ICD-10-CM | POA: Diagnosis not present

## 2017-08-17 DIAGNOSIS — R809 Proteinuria, unspecified: Secondary | ICD-10-CM | POA: Diagnosis not present

## 2017-08-17 DIAGNOSIS — I129 Hypertensive chronic kidney disease with stage 1 through stage 4 chronic kidney disease, or unspecified chronic kidney disease: Secondary | ICD-10-CM | POA: Diagnosis not present

## 2017-08-17 DIAGNOSIS — N183 Chronic kidney disease, stage 3 (moderate): Secondary | ICD-10-CM | POA: Diagnosis not present

## 2017-08-17 DIAGNOSIS — E7800 Pure hypercholesterolemia, unspecified: Secondary | ICD-10-CM | POA: Diagnosis not present

## 2017-08-17 DIAGNOSIS — E1121 Type 2 diabetes mellitus with diabetic nephropathy: Secondary | ICD-10-CM | POA: Diagnosis not present

## 2018-02-23 DIAGNOSIS — I129 Hypertensive chronic kidney disease with stage 1 through stage 4 chronic kidney disease, or unspecified chronic kidney disease: Secondary | ICD-10-CM | POA: Diagnosis not present

## 2018-02-23 DIAGNOSIS — E7800 Pure hypercholesterolemia, unspecified: Secondary | ICD-10-CM | POA: Diagnosis not present

## 2018-02-23 DIAGNOSIS — N183 Chronic kidney disease, stage 3 (moderate): Secondary | ICD-10-CM | POA: Diagnosis not present

## 2018-02-23 DIAGNOSIS — E1169 Type 2 diabetes mellitus with other specified complication: Secondary | ICD-10-CM | POA: Diagnosis not present

## 2018-02-23 DIAGNOSIS — Z0000 Encounter for general adult medical examination without abnormal findings: Secondary | ICD-10-CM | POA: Diagnosis not present

## 2018-03-04 ENCOUNTER — Other Ambulatory Visit: Payer: Self-pay | Admitting: Physician Assistant

## 2018-03-04 ENCOUNTER — Other Ambulatory Visit (HOSPITAL_BASED_OUTPATIENT_CLINIC_OR_DEPARTMENT_OTHER): Payer: Self-pay | Admitting: Physician Assistant

## 2018-03-04 DIAGNOSIS — Z1231 Encounter for screening mammogram for malignant neoplasm of breast: Secondary | ICD-10-CM

## 2018-03-20 ENCOUNTER — Ambulatory Visit (HOSPITAL_BASED_OUTPATIENT_CLINIC_OR_DEPARTMENT_OTHER): Payer: Federal, State, Local not specified - PPO

## 2018-03-22 ENCOUNTER — Ambulatory Visit (HOSPITAL_BASED_OUTPATIENT_CLINIC_OR_DEPARTMENT_OTHER): Payer: Federal, State, Local not specified - PPO

## 2018-03-22 ENCOUNTER — Ambulatory Visit (HOSPITAL_BASED_OUTPATIENT_CLINIC_OR_DEPARTMENT_OTHER)
Admission: RE | Admit: 2018-03-22 | Discharge: 2018-03-22 | Disposition: A | Discharge status: Home / Self Care | Payer: Federal, State, Local not specified - PPO | Source: Ambulatory Visit | Attending: Physician Assistant | Admitting: Physician Assistant

## 2018-03-22 DIAGNOSIS — Z1231 Encounter for screening mammogram for malignant neoplasm of breast: Secondary | ICD-10-CM | POA: Diagnosis not present

## 2018-03-23 ENCOUNTER — Ambulatory Visit: Payer: Federal, State, Local not specified - PPO

## 2018-03-23 DIAGNOSIS — K080 Exfoliation of teeth due to systemic causes: Secondary | ICD-10-CM | POA: Diagnosis not present

## 2018-06-03 DIAGNOSIS — M25561 Pain in right knee: Secondary | ICD-10-CM | POA: Diagnosis not present

## 2018-06-03 DIAGNOSIS — M25512 Pain in left shoulder: Secondary | ICD-10-CM | POA: Diagnosis not present

## 2018-06-18 DIAGNOSIS — S8991XA Unspecified injury of right lower leg, initial encounter: Secondary | ICD-10-CM | POA: Diagnosis not present

## 2018-06-18 DIAGNOSIS — M25561 Pain in right knee: Secondary | ICD-10-CM | POA: Diagnosis not present

## 2018-11-19 DIAGNOSIS — M1711 Unilateral primary osteoarthritis, right knee: Secondary | ICD-10-CM | POA: Diagnosis not present

## 2018-11-19 DIAGNOSIS — Z79899 Other long term (current) drug therapy: Secondary | ICD-10-CM | POA: Diagnosis not present

## 2018-11-19 DIAGNOSIS — L400 Psoriasis vulgaris: Secondary | ICD-10-CM | POA: Diagnosis not present

## 2018-12-27 DIAGNOSIS — M545 Low back pain: Secondary | ICD-10-CM | POA: Diagnosis not present

## 2018-12-27 DIAGNOSIS — M1711 Unilateral primary osteoarthritis, right knee: Secondary | ICD-10-CM | POA: Diagnosis not present

## 2018-12-27 DIAGNOSIS — M25551 Pain in right hip: Secondary | ICD-10-CM | POA: Diagnosis not present

## 2019-01-04 ENCOUNTER — Other Ambulatory Visit: Payer: Self-pay

## 2019-01-04 ENCOUNTER — Encounter: Payer: Self-pay | Admitting: Sports Medicine

## 2019-01-04 ENCOUNTER — Ambulatory Visit (INDEPENDENT_AMBULATORY_CARE_PROVIDER_SITE_OTHER): Payer: Federal, State, Local not specified - PPO | Admitting: Sports Medicine

## 2019-01-04 VITALS — BP 112/76 | Ht 65.0 in | Wt 189.0 lb

## 2019-01-04 DIAGNOSIS — M25561 Pain in right knee: Secondary | ICD-10-CM | POA: Diagnosis not present

## 2019-01-05 ENCOUNTER — Encounter: Payer: Self-pay | Admitting: Sports Medicine

## 2019-01-05 NOTE — Progress Notes (Signed)
   Subjective:    Patient ID: Theresa [name], female    DOB: 25-Oct-1955, 63 y.o.   MRN: [id number]  HPI chief complaint: Right knee pain  63 year old female comes in today for ultrasound evaluation of the right knee.  She recently saw Dr. Noemi Chapel after experiencing right knee pain a little over 6 weeks ago.  She was initially seen in the [facility] urgent care and given a cortisone injection which provided minimal symptom relief.  She then saw Dr. Noemi Chapel on August 3.  He diagnosed her with a Baker's cyst and referred her to our office for further evaluation and treatment.  Patient endorses most of her pain in the posterior knee.  Pain will radiate proximally up into the hamstring.  She denies pain in the calf.  She describes it as a pulling sensation.  Her pain is quite severe.  She has had x-rays which showed "mild arthritis".  She denies any trauma.  She has tried meloxicam with limited symptom relief.  She is also currently on tizanidine for muscle spasm at night.  She has tried a compression sleeve but that makes her pain worse.  Past medical history reviewed Medications reviewed Allergies reviewed  Review of Systems    As above Objective:   Physical Exam  Well-developed, well-nourished.  No acute distress.  Awake alert and oriented x3.  Vital signs reviewed.  Right knee: Range of motion 0 to 120 degrees.  She is tender to palpation along medial and lateral joint lines.  Negative McMurray's.  There is palpable fullness in the popliteal fossa.  She is tender to palpation here.  No calf tenderness.  No lower extremity swelling.  Good pulses distally.  Walking with a noticeable limp.  [facility] ultrasound of the right knee was performed.  Limited images were obtained.  Careful, thorough evaluation of the popliteal fossa shows no evidence of a Baker's cyst.  Area of maximum tenderness was palpated and there was no obvious cyst in this area.  Popliteal vein and artery appear to be unremarkable.   Ultrasound evaluation of the suprapatellar pouch does show a small effusion.      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  Patient has rather significant pain despite having only mild arthritis on her x-ray.  I recommended further diagnostic imaging in the form of an MRI.  Phone follow-up with those findings when available.  We will delineate further treatment based on those results.

## 2019-01-06 ENCOUNTER — Other Ambulatory Visit: Payer: Self-pay

## 2019-01-06 ENCOUNTER — Ambulatory Visit
Admission: RE | Admit: 2019-01-06 | Discharge: 2019-01-06 | Disposition: A | Discharge status: Home / Self Care | Payer: Federal, State, Local not specified - PPO | Source: Ambulatory Visit | Attending: Sports Medicine | Admitting: Sports Medicine

## 2019-01-06 DIAGNOSIS — M25561 Pain in right knee: Secondary | ICD-10-CM

## 2019-01-10 ENCOUNTER — Telehealth: Payer: Self-pay | Admitting: Sports Medicine

## 2019-01-10 NOTE — Telephone Encounter (Signed)
  I spoke with Theresa [name] on the phone today after reviewing MRI findings of her right knee.  MRI shows extensive and complex tears of the medial meniscus with a flap type tear and a flipped meniscal fragment between the medial femoral condyle and medial joint capsule.  She also has tricompartmental degenerative changes.  Very small Baker's cyst is also seen. Ultrasound evaluation done in my office last week did not show an obvious cyst.  Certainly nothing large enough to drain..  She has had an intra-articular cortisone injection and is currently on prescription strength anti-inflammatory.  She states that overall her knee is improving.  If her knee continues to hurt, I recommended that she return to Dr. Noemi Chapel to discuss possible arthroscopy.  I also explained to her that she has extensive arthritis throughout the knee and sometimes symptoms will persist status post arthroscopy if her pain is from the arthritis and not the meniscus.  She understands.  She will follow-up with me as needed.

## 2019-02-02 ENCOUNTER — Other Ambulatory Visit: Payer: Federal, State, Local not specified - PPO

## 2019-02-16 DIAGNOSIS — L400 Psoriasis vulgaris: Secondary | ICD-10-CM | POA: Diagnosis not present

## 2019-03-03 DIAGNOSIS — R809 Proteinuria, unspecified: Secondary | ICD-10-CM | POA: Diagnosis not present

## 2019-03-03 DIAGNOSIS — E7800 Pure hypercholesterolemia, unspecified: Secondary | ICD-10-CM | POA: Diagnosis not present

## 2019-03-03 DIAGNOSIS — Z0000 Encounter for general adult medical examination without abnormal findings: Secondary | ICD-10-CM | POA: Diagnosis not present

## 2019-03-03 DIAGNOSIS — E1169 Type 2 diabetes mellitus with other specified complication: Secondary | ICD-10-CM | POA: Diagnosis not present

## 2019-03-03 DIAGNOSIS — I129 Hypertensive chronic kidney disease with stage 1 through stage 4 chronic kidney disease, or unspecified chronic kidney disease: Secondary | ICD-10-CM | POA: Diagnosis not present

## 2019-03-14 DIAGNOSIS — K080 Exfoliation of teeth due to systemic causes: Secondary | ICD-10-CM | POA: Diagnosis not present

## 2019-03-23 ENCOUNTER — Other Ambulatory Visit: Payer: Self-pay

## 2019-03-23 ENCOUNTER — Ambulatory Visit (HOSPITAL_BASED_OUTPATIENT_CLINIC_OR_DEPARTMENT_OTHER): Payer: Federal, State, Local not specified - PPO

## 2019-03-23 ENCOUNTER — Ambulatory Visit (HOSPITAL_BASED_OUTPATIENT_CLINIC_OR_DEPARTMENT_OTHER)
Admission: RE | Admit: 2019-03-23 | Discharge: 2019-03-23 | Disposition: A | Discharge status: Home / Self Care | Payer: Federal, State, Local not specified - PPO | Source: Ambulatory Visit | Attending: Physician Assistant | Admitting: Physician Assistant

## 2019-03-23 DIAGNOSIS — Z1231 Encounter for screening mammogram for malignant neoplasm of breast: Secondary | ICD-10-CM | POA: Diagnosis not present

## 2019-03-23 DIAGNOSIS — L400 Psoriasis vulgaris: Secondary | ICD-10-CM | POA: Diagnosis not present

## 2019-06-22 DIAGNOSIS — L400 Psoriasis vulgaris: Secondary | ICD-10-CM | POA: Diagnosis not present

## 2019-07-19 DIAGNOSIS — E119 Type 2 diabetes mellitus without complications: Secondary | ICD-10-CM | POA: Diagnosis not present

## 2019-07-19 DIAGNOSIS — H2513 Age-related nuclear cataract, bilateral: Secondary | ICD-10-CM | POA: Diagnosis not present

## 2019-07-19 DIAGNOSIS — H04123 Dry eye syndrome of bilateral lacrimal glands: Secondary | ICD-10-CM | POA: Diagnosis not present

## 2019-07-19 DIAGNOSIS — H21233 Degeneration of iris (pigmentary), bilateral: Secondary | ICD-10-CM | POA: Diagnosis not present

## 2019-08-06 ENCOUNTER — Ambulatory Visit: Payer: Federal, State, Local not specified - PPO | Attending: Internal Medicine

## 2019-08-06 DIAGNOSIS — Z23 Encounter for immunization: Secondary | ICD-10-CM

## 2019-08-06 NOTE — Progress Notes (Signed)
   Covid-19 Vaccination Clinic  Name:  Theresa [name]    MRN: [id number] DOB: Jul 11, 1955  08/06/2019  [name] was observed post Covid-19 immunization for 15 minutes without incident. She was provided with Vaccine Information Sheet and instruction to access the V-Safe system.   [name] was instructed to call 911 with any severe reactions post vaccine: Marland Kitchen Difficulty breathing  . Swelling of face and throat  . A fast heartbeat  . A bad rash all over body  . Dizziness and weakness   Immunizations Administered    Name Date Dose VIS Date Route   Pfizer COVID-19 Vaccine 08/06/2019  4:02 PM 0.3 mL 05/06/2019 Intramuscular   Manufacturer: [employer], [employer]   Lot: [id number]   NDC: [address]-9

## 2019-08-29 ENCOUNTER — Ambulatory Visit: Payer: Federal, State, Local not specified - PPO | Attending: Internal Medicine

## 2019-08-29 DIAGNOSIS — Z23 Encounter for immunization: Secondary | ICD-10-CM

## 2019-08-29 NOTE — Progress Notes (Signed)
   Covid-19 Vaccination Clinic  Name:  Theresa [name]    MRN: [id number] DOB: 1956-01-05  08/29/2019  [name] was observed post Covid-19 immunization for 15 minutes without incident. She was provided with Vaccine Information Sheet and instruction to access the V-Safe system.   [name] was instructed to call 911 with any severe reactions post vaccine: Marland Kitchen Difficulty breathing  . Swelling of face and throat  . A fast heartbeat  . A bad rash all over body  . Dizziness and weakness   Immunizations Administered    Name Date Dose VIS Date Route   Pfizer COVID-19 Vaccine 08/29/2019  3:54 PM 0.3 mL 05/06/2019 Intramuscular   Manufacturer: [employer], [employer]   Lot: [id number]   NDC: [address]-8

## 2019-08-30 ENCOUNTER — Ambulatory Visit: Payer: Federal, State, Local not specified - PPO

## 2019-09-06 DIAGNOSIS — E1169 Type 2 diabetes mellitus with other specified complication: Secondary | ICD-10-CM | POA: Diagnosis not present

## 2019-09-06 DIAGNOSIS — I129 Hypertensive chronic kidney disease with stage 1 through stage 4 chronic kidney disease, or unspecified chronic kidney disease: Secondary | ICD-10-CM | POA: Diagnosis not present

## 2019-09-06 DIAGNOSIS — N1831 Chronic kidney disease, stage 3a: Secondary | ICD-10-CM | POA: Diagnosis not present

## 2019-09-06 DIAGNOSIS — E7800 Pure hypercholesterolemia, unspecified: Secondary | ICD-10-CM | POA: Diagnosis not present

## 2019-09-21 DIAGNOSIS — L400 Psoriasis vulgaris: Secondary | ICD-10-CM | POA: Diagnosis not present

## 2019-09-23 DIAGNOSIS — E876 Hypokalemia: Secondary | ICD-10-CM | POA: Diagnosis not present

## 2019-12-22 DIAGNOSIS — L400 Psoriasis vulgaris: Secondary | ICD-10-CM | POA: Diagnosis not present

## 2020-01-26 DIAGNOSIS — E119 Type 2 diabetes mellitus without complications: Secondary | ICD-10-CM | POA: Diagnosis not present

## 2020-01-26 DIAGNOSIS — H04123 Dry eye syndrome of bilateral lacrimal glands: Secondary | ICD-10-CM | POA: Diagnosis not present

## 2020-01-26 DIAGNOSIS — H43811 Vitreous degeneration, right eye: Secondary | ICD-10-CM | POA: Diagnosis not present

## 2020-01-26 DIAGNOSIS — H2513 Age-related nuclear cataract, bilateral: Secondary | ICD-10-CM | POA: Diagnosis not present

## 2020-01-26 DIAGNOSIS — H21233 Degeneration of iris (pigmentary), bilateral: Secondary | ICD-10-CM | POA: Diagnosis not present

## 2020-03-01 ENCOUNTER — Other Ambulatory Visit (HOSPITAL_BASED_OUTPATIENT_CLINIC_OR_DEPARTMENT_OTHER): Payer: Self-pay | Admitting: Family Medicine

## 2020-03-01 DIAGNOSIS — Z1231 Encounter for screening mammogram for malignant neoplasm of breast: Secondary | ICD-10-CM

## 2020-03-07 DIAGNOSIS — L400 Psoriasis vulgaris: Secondary | ICD-10-CM | POA: Diagnosis not present

## 2020-03-09 DIAGNOSIS — E1169 Type 2 diabetes mellitus with other specified complication: Secondary | ICD-10-CM | POA: Diagnosis not present

## 2020-03-09 DIAGNOSIS — R809 Proteinuria, unspecified: Secondary | ICD-10-CM | POA: Diagnosis not present

## 2020-03-09 DIAGNOSIS — E7800 Pure hypercholesterolemia, unspecified: Secondary | ICD-10-CM | POA: Diagnosis not present

## 2020-03-09 DIAGNOSIS — Z124 Encounter for screening for malignant neoplasm of cervix: Secondary | ICD-10-CM | POA: Diagnosis not present

## 2020-03-09 DIAGNOSIS — I129 Hypertensive chronic kidney disease with stage 1 through stage 4 chronic kidney disease, or unspecified chronic kidney disease: Secondary | ICD-10-CM | POA: Diagnosis not present

## 2020-03-09 DIAGNOSIS — Z0000 Encounter for general adult medical examination without abnormal findings: Secondary | ICD-10-CM | POA: Diagnosis not present

## 2020-03-10 ENCOUNTER — Other Ambulatory Visit: Payer: Self-pay

## 2020-03-10 ENCOUNTER — Ambulatory Visit: Payer: Federal, State, Local not specified - PPO | Attending: Internal Medicine

## 2020-03-10 DIAGNOSIS — Z23 Encounter for immunization: Secondary | ICD-10-CM

## 2020-03-10 NOTE — Progress Notes (Signed)
° °  Covid-19 Vaccination Clinic  Name:  Theresa [name]    MRN: [id number] DOB: Feb 21, 1956  03/10/2020  [name] was observed post Covid-19 immunization for 15 minutes without incident. She was provided with Vaccine Information Sheet and instruction to access the V-Safe system.   [name] was instructed to call 911 with any severe reactions post vaccine:  Difficulty breathing   Swelling of face and throat   A fast heartbeat   A bad rash all over body   Dizziness and weakness

## 2020-03-23 ENCOUNTER — Ambulatory Visit: Payer: Federal, State, Local not specified - PPO

## 2020-04-23 DIAGNOSIS — L400 Psoriasis vulgaris: Secondary | ICD-10-CM | POA: Diagnosis not present

## 2020-06-05 IMAGING — MG DIGITAL SCREENING BILAT W/ TOMO
8 series · 8 of 24 positions shown · non-contrast
Comparison: Previous exam(s).

CLINICAL DATA: Screening.

EXAM:
DIGITAL SCREENING BILATERAL MAMMOGRAM WITH TOMO AND CAD

[R MLO synth-2D]
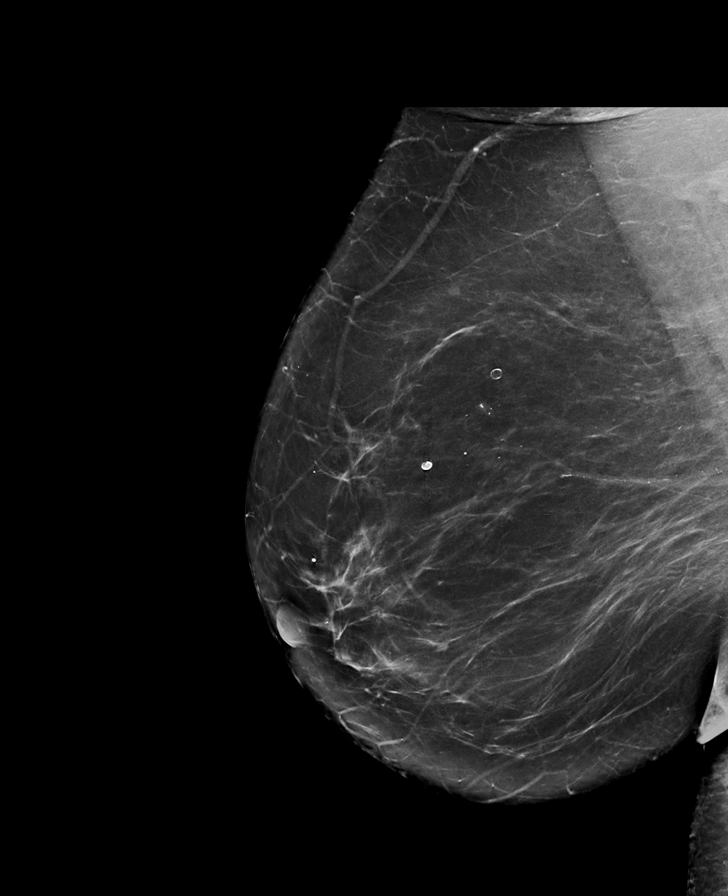

[L CC synth-2D]
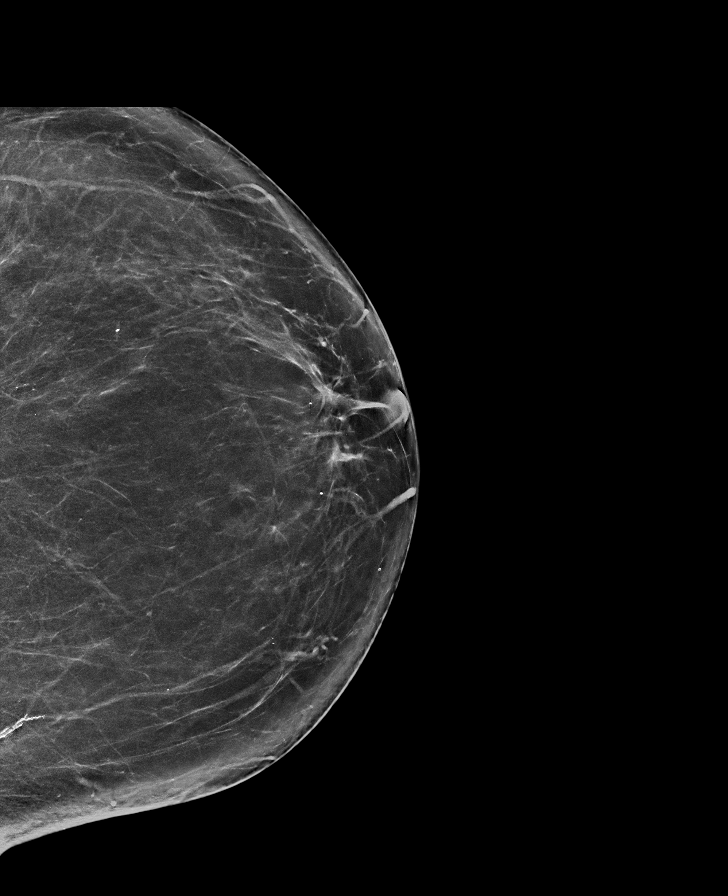

[L MLO synth-2D]
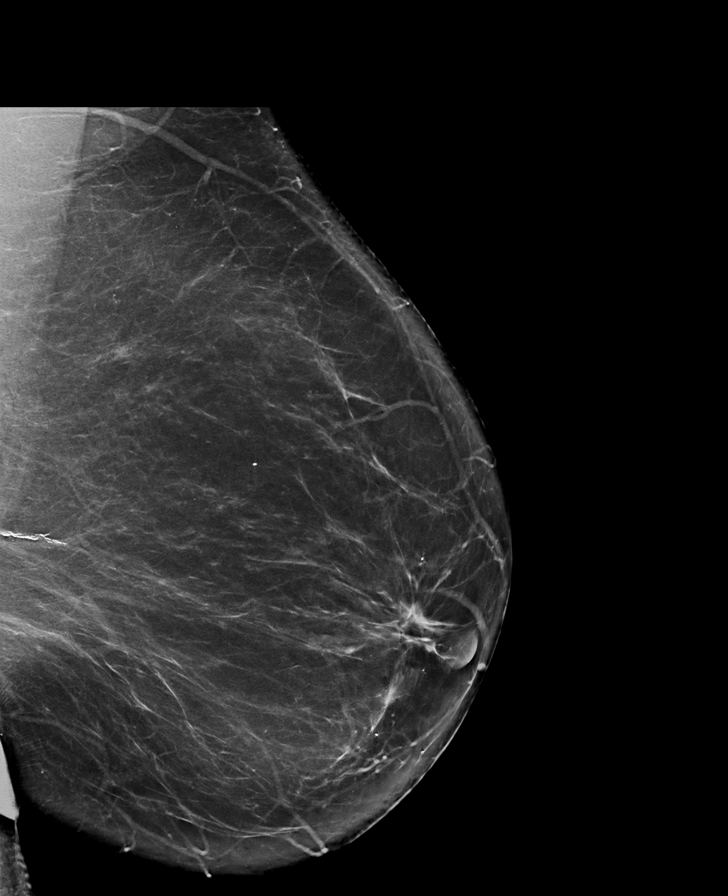

[R CC synth-2D]
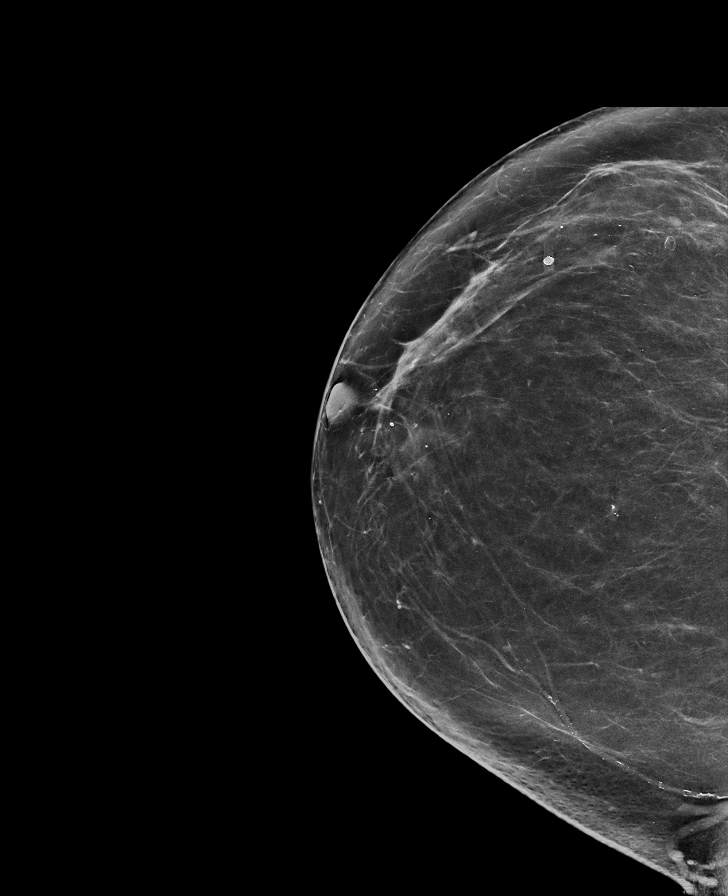

[R MLO tomo · tomo slice 47/94.0]
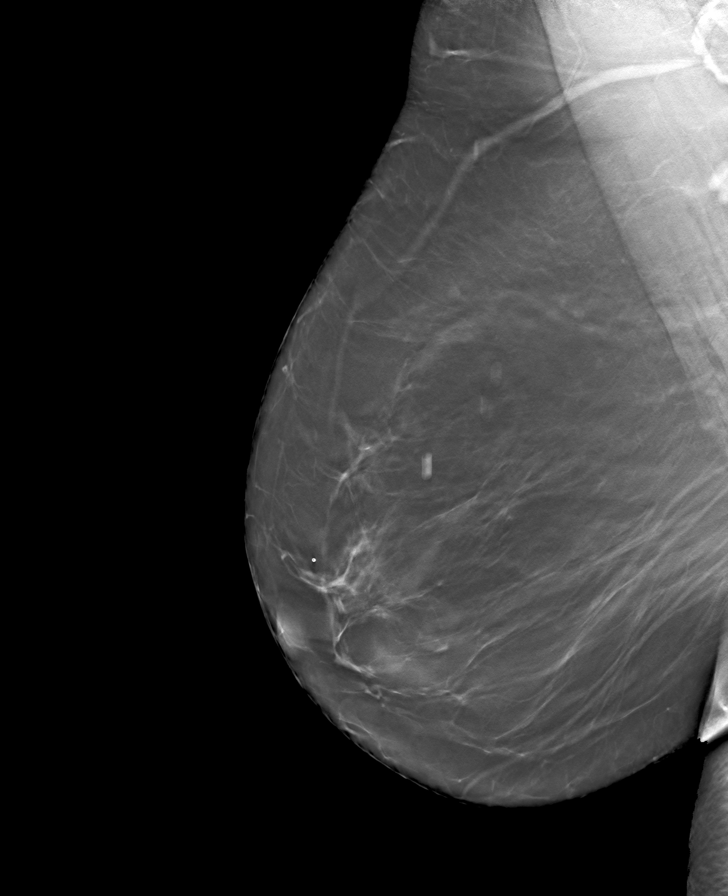

[R CC tomo · tomo slice 40/79.0]
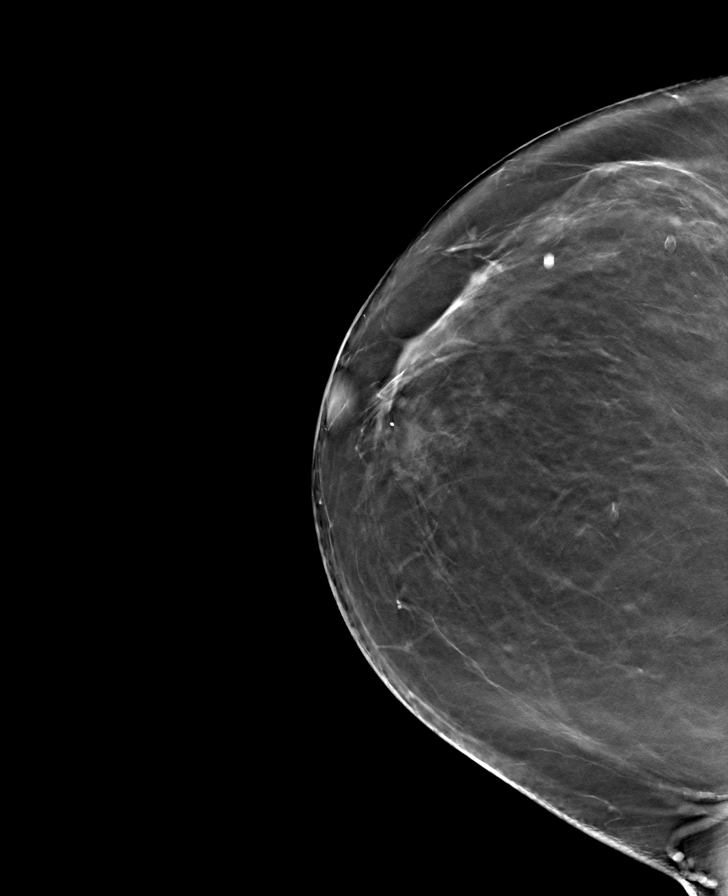

[L CC tomo · tomo slice 41/82.0]
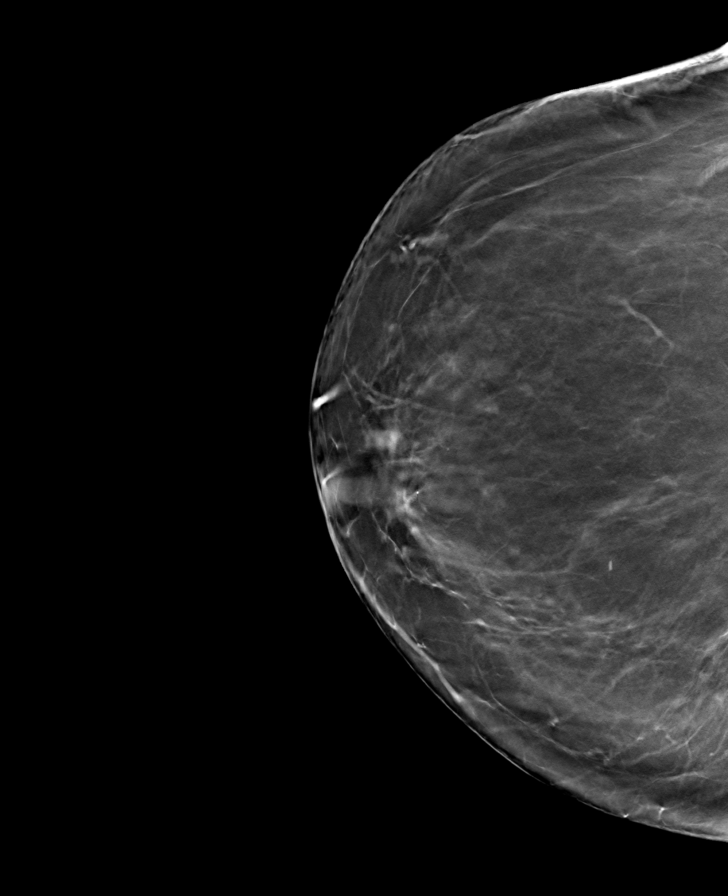

[L MLO tomo · tomo slice 47/92.0]
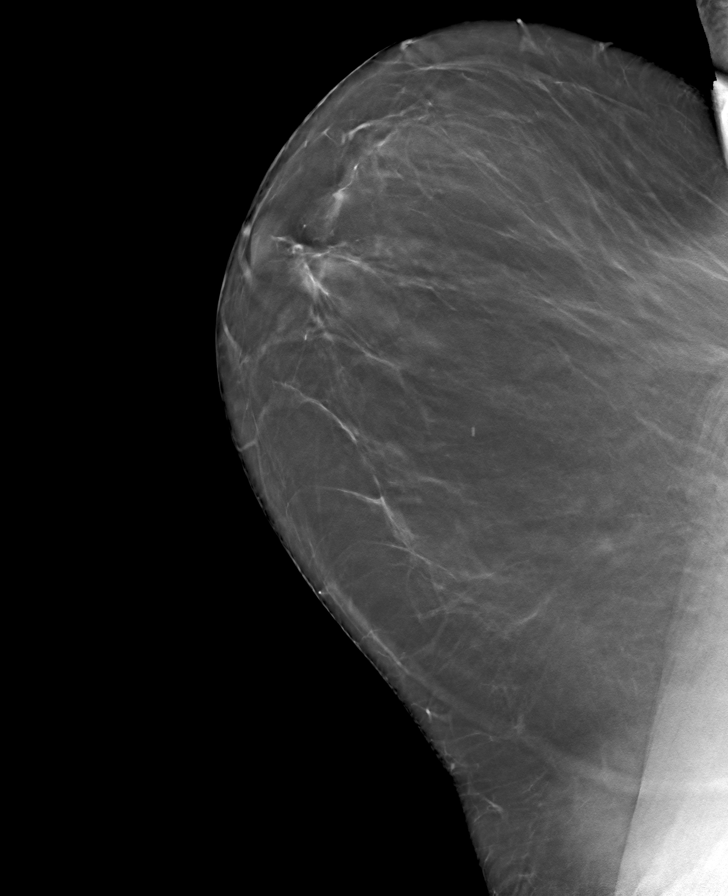

[8 of 24 positions shown; findings below may reference images not displayed]

ACR Breast Density Category b: There are scattered areas of
fibroglandular density.
FINDINGS: There are no findings suspicious for malignancy. Images were
processed with CAD.
IMPRESSION: No mammographic evidence of malignancy. A result letter of this
screening mammogram will be mailed directly to the patient.

RECOMMENDATION:
Screening mammogram in one year. (Code:[id number])

BI-RADS CATEGORY  1: Negative.

## 2020-06-21 DIAGNOSIS — L400 Psoriasis vulgaris: Secondary | ICD-10-CM | POA: Diagnosis not present

## 2020-08-02 DIAGNOSIS — H21233 Degeneration of iris (pigmentary), bilateral: Secondary | ICD-10-CM | POA: Diagnosis not present

## 2020-08-28 DIAGNOSIS — M1711 Unilateral primary osteoarthritis, right knee: Secondary | ICD-10-CM | POA: Diagnosis not present

## 2020-09-20 DIAGNOSIS — L400 Psoriasis vulgaris: Secondary | ICD-10-CM | POA: Diagnosis not present

## 2020-10-16 DIAGNOSIS — M1711 Unilateral primary osteoarthritis, right knee: Secondary | ICD-10-CM | POA: Diagnosis not present

## 2020-10-17 DIAGNOSIS — M6281 Muscle weakness (generalized): Secondary | ICD-10-CM | POA: Diagnosis not present

## 2020-10-17 DIAGNOSIS — M1711 Unilateral primary osteoarthritis, right knee: Secondary | ICD-10-CM | POA: Diagnosis not present

## 2020-12-12 DIAGNOSIS — E1169 Type 2 diabetes mellitus with other specified complication: Secondary | ICD-10-CM | POA: Diagnosis not present

## 2020-12-12 DIAGNOSIS — I129 Hypertensive chronic kidney disease with stage 1 through stage 4 chronic kidney disease, or unspecified chronic kidney disease: Secondary | ICD-10-CM | POA: Diagnosis not present

## 2020-12-12 DIAGNOSIS — E7800 Pure hypercholesterolemia, unspecified: Secondary | ICD-10-CM | POA: Diagnosis not present

## 2020-12-12 DIAGNOSIS — N1831 Chronic kidney disease, stage 3a: Secondary | ICD-10-CM | POA: Diagnosis not present

## 2020-12-21 ENCOUNTER — Ambulatory Visit: Payer: Federal, State, Local not specified - PPO | Admitting: Podiatry

## 2020-12-21 ENCOUNTER — Ambulatory Visit (INDEPENDENT_AMBULATORY_CARE_PROVIDER_SITE_OTHER): Payer: Federal, State, Local not specified - PPO

## 2020-12-21 ENCOUNTER — Other Ambulatory Visit: Payer: Self-pay

## 2020-12-21 ENCOUNTER — Encounter: Payer: Self-pay | Admitting: Podiatry

## 2020-12-21 DIAGNOSIS — M722 Plantar fascial fibromatosis: Secondary | ICD-10-CM

## 2020-12-21 DIAGNOSIS — M2042 Other hammer toe(s) (acquired), left foot: Secondary | ICD-10-CM

## 2020-12-21 DIAGNOSIS — M7662 Achilles tendinitis, left leg: Secondary | ICD-10-CM

## 2020-12-21 DIAGNOSIS — M21619 Bunion of unspecified foot: Secondary | ICD-10-CM | POA: Diagnosis not present

## 2020-12-21 DIAGNOSIS — L400 Psoriasis vulgaris: Secondary | ICD-10-CM | POA: Diagnosis not present

## 2020-12-21 MED ORDER — TRIAMCINOLONE ACETONIDE 10 MG/ML IJ SUSP
10.0000 mg | Freq: Once | INTRAMUSCULAR | Status: AC
  Administered 2020-12-21: 10 mg

## 2020-12-21 NOTE — Progress Notes (Signed)
Subjective:   Patient ID: Theresa [name], female   DOB: 65 y.o.   MRN: [id number]   HPI Patient presents stating that she has developed severe pain in the back of her left heel of approximate 1 week duration and also has a significant hammertoe deformity left second toe and bunion deformity bilateral that are painful and she needs to have it fixed.  States that the back of the heel was making it hard for her to wear shoe gear or be active.  Patient does not smoke likes to be active   Review of Systems  All other systems reviewed and are negative.      Objective:  Physical Exam Vitals and nursing note reviewed.  Constitutional:      Appearance: She is well-developed.  Pulmonary:     Effort: Pulmonary effort is normal.  Musculoskeletal:        General: Normal range of motion.  Skin:    General: Skin is warm.  Neurological:     Mental Status: She is alert.    Neurovascular status intact muscle strength adequate range of motion within normal limits.  Patient is found to have hyperostosis with first MPJ enlargement bilateral with redness and pain and an elevated second digit left foot rigid with keratotic lesion painful when pressed with history of hammertoe correction by me a number of years ago.  The posterior heel lateral side is inflamed at the Achilles insertion with no indication of tendon damage or muscle strength loss.  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  At this point organ to focus on the Achilles and I did do a careful injection after explaining chances for rupture and that I want her in an air fracture walker postoperatively which was dispensed today.  I utilize 3 mg Dexasone Kenalog 5 g Xylocaine not putting it directly into the tendon to keep it in the lateral discussed digital and  bunion correction which we will discuss and schedule in 2 weeks  X-rays indicate significant elevation of the intermetatarsal angle bilateral rigid contracture digit to left with posterior spur formation heel left

## 2021-01-03 ENCOUNTER — Encounter: Payer: Self-pay | Admitting: Podiatry

## 2021-01-03 ENCOUNTER — Other Ambulatory Visit: Payer: Self-pay

## 2021-01-03 ENCOUNTER — Ambulatory Visit: Payer: Federal, State, Local not specified - PPO | Admitting: Podiatry

## 2021-01-03 DIAGNOSIS — M7662 Achilles tendinitis, left leg: Secondary | ICD-10-CM | POA: Diagnosis not present

## 2021-01-03 DIAGNOSIS — M2042 Other hammer toe(s) (acquired), left foot: Secondary | ICD-10-CM | POA: Diagnosis not present

## 2021-01-03 DIAGNOSIS — M21619 Bunion of unspecified foot: Secondary | ICD-10-CM | POA: Diagnosis not present

## 2021-01-03 NOTE — Progress Notes (Signed)
Subjective:   Patient ID: Theresa [name], female   DOB: 65 y.o.   MRN: [id number]   HPI Patient presents stating my back of the heel is doing much better and I want to get my bunion my toe fixed.  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  I allowed her to read consent form going over with her complications and surgery which will include distal osteotomy left first metatarsal digital fusion with shortening digit to left and distal arthroplasty digit 4 left foot there is been moderate regrowth of bone.  I explained all possible complications and the recovery can take upwards of 4 to 6 months and patient wants surgery and after extensive review signed consent form.  Patient is scheduled for outpatient surgery encouraged to call questions concerns which may arise

## 2021-02-07 ENCOUNTER — Telehealth: Payer: Self-pay | Admitting: Urology

## 2021-02-07 NOTE — Telephone Encounter (Signed)
DOS - 03/05/21  [place] BUNIONECTOMY LEFT --- [id number] HAMMERTOE REPAIR 2,4 LEFT --- [id number]   [employer] EFFECTIVE DATE - 06/06/00  PLAN DEDUCTIBLE -  $0.00 OUT OF POCKET - $6,500.00 W/ $5,633.00 REMAINING COINSURANCE - 0% COPAY - $100.00   NO PRIOR AUTH IS REQUIRED

## 2021-02-15 DIAGNOSIS — L0201 Cutaneous abscess of face: Secondary | ICD-10-CM | POA: Diagnosis not present

## 2021-02-25 ENCOUNTER — Encounter (HOSPITAL_BASED_OUTPATIENT_CLINIC_OR_DEPARTMENT_OTHER): Payer: Self-pay

## 2021-02-25 ENCOUNTER — Ambulatory Visit (HOSPITAL_BASED_OUTPATIENT_CLINIC_OR_DEPARTMENT_OTHER)
Admission: RE | Admit: 2021-02-25 | Discharge: 2021-02-25 | Disposition: A | Discharge status: Home / Self Care | Payer: Federal, State, Local not specified - PPO | Source: Ambulatory Visit | Attending: Family Medicine | Admitting: Family Medicine

## 2021-02-25 ENCOUNTER — Other Ambulatory Visit: Payer: Self-pay

## 2021-02-25 ENCOUNTER — Telehealth: Payer: Self-pay | Admitting: Urology

## 2021-02-25 DIAGNOSIS — Z1231 Encounter for screening mammogram for malignant neoplasm of breast: Secondary | ICD-10-CM | POA: Insufficient documentation

## 2021-02-25 NOTE — Telephone Encounter (Signed)
PATIENT CALLED STATING THAT SHE NEEDS TO CANCEL HER SX, HER MOM WAS JUST DIAGNOSED WITH ALZHEIMER AND SHE WILL BE TAKING CARE OF HER AND FINDING A FACILITY FOR HER. SO PATIENT STATS SHE WILL CALL BACK TO RESCHEDULE WHEN SHE CAN. CYNTHIA WITH [facility] AND DR. REGAL HAVE BEEN NOTIFIED

## 2021-03-04 ENCOUNTER — Encounter: Payer: Federal, State, Local not specified - PPO | Admitting: Podiatry

## 2021-03-11 ENCOUNTER — Encounter: Payer: Federal, State, Local not specified - PPO | Admitting: Podiatry

## 2021-03-19 DIAGNOSIS — Z0000 Encounter for general adult medical examination without abnormal findings: Secondary | ICD-10-CM | POA: Diagnosis not present

## 2021-03-25 DIAGNOSIS — L400 Psoriasis vulgaris: Secondary | ICD-10-CM | POA: Diagnosis not present

## 2021-04-15 DIAGNOSIS — H5203 Hypermetropia, bilateral: Secondary | ICD-10-CM | POA: Diagnosis not present

## 2021-04-15 DIAGNOSIS — E119 Type 2 diabetes mellitus without complications: Secondary | ICD-10-CM | POA: Diagnosis not present

## 2021-04-15 DIAGNOSIS — H2513 Age-related nuclear cataract, bilateral: Secondary | ICD-10-CM | POA: Diagnosis not present

## 2021-04-15 DIAGNOSIS — H21233 Degeneration of iris (pigmentary), bilateral: Secondary | ICD-10-CM | POA: Diagnosis not present

## 2021-04-15 DIAGNOSIS — H04123 Dry eye syndrome of bilateral lacrimal glands: Secondary | ICD-10-CM | POA: Diagnosis not present

## 2021-05-15 DIAGNOSIS — Z79899 Other long term (current) drug therapy: Secondary | ICD-10-CM | POA: Diagnosis not present

## 2021-05-15 DIAGNOSIS — L400 Psoriasis vulgaris: Secondary | ICD-10-CM | POA: Diagnosis not present

## 2021-06-05 DIAGNOSIS — L400 Psoriasis vulgaris: Secondary | ICD-10-CM | POA: Diagnosis not present

## 2021-06-05 DIAGNOSIS — Z79899 Other long term (current) drug therapy: Secondary | ICD-10-CM | POA: Diagnosis not present

## 2021-06-20 DIAGNOSIS — L400 Psoriasis vulgaris: Secondary | ICD-10-CM | POA: Diagnosis not present

## 2021-09-16 DIAGNOSIS — L400 Psoriasis vulgaris: Secondary | ICD-10-CM | POA: Diagnosis not present

## 2021-10-24 DIAGNOSIS — H21233 Degeneration of iris (pigmentary), bilateral: Secondary | ICD-10-CM | POA: Diagnosis not present

## 2021-10-24 DIAGNOSIS — H04123 Dry eye syndrome of bilateral lacrimal glands: Secondary | ICD-10-CM | POA: Diagnosis not present

## 2021-12-17 DIAGNOSIS — L400 Psoriasis vulgaris: Secondary | ICD-10-CM | POA: Diagnosis not present

## 2022-01-30 ENCOUNTER — Other Ambulatory Visit (HOSPITAL_BASED_OUTPATIENT_CLINIC_OR_DEPARTMENT_OTHER): Payer: Self-pay | Admitting: Physician Assistant

## 2022-01-30 DIAGNOSIS — Z1231 Encounter for screening mammogram for malignant neoplasm of breast: Secondary | ICD-10-CM

## 2022-03-04 ENCOUNTER — Ambulatory Visit (HOSPITAL_BASED_OUTPATIENT_CLINIC_OR_DEPARTMENT_OTHER): Payer: Federal, State, Local not specified - PPO

## 2022-03-05 ENCOUNTER — Encounter (HOSPITAL_BASED_OUTPATIENT_CLINIC_OR_DEPARTMENT_OTHER): Payer: Self-pay

## 2022-03-05 ENCOUNTER — Ambulatory Visit (HOSPITAL_BASED_OUTPATIENT_CLINIC_OR_DEPARTMENT_OTHER)
Admission: RE | Admit: 2022-03-05 | Discharge: 2022-03-05 | Disposition: A | Discharge status: Home / Self Care | Payer: Federal, State, Local not specified - PPO | Source: Ambulatory Visit | Attending: Physician Assistant | Admitting: Physician Assistant

## 2022-03-05 DIAGNOSIS — Z1231 Encounter for screening mammogram for malignant neoplasm of breast: Secondary | ICD-10-CM | POA: Diagnosis not present

## 2022-03-21 ENCOUNTER — Other Ambulatory Visit (HOSPITAL_BASED_OUTPATIENT_CLINIC_OR_DEPARTMENT_OTHER): Payer: Self-pay | Admitting: Physician Assistant

## 2022-03-21 DIAGNOSIS — R8290 Unspecified abnormal findings in urine: Secondary | ICD-10-CM | POA: Diagnosis not present

## 2022-03-21 DIAGNOSIS — Z13820 Encounter for screening for osteoporosis: Secondary | ICD-10-CM

## 2022-03-21 DIAGNOSIS — Z0000 Encounter for general adult medical examination without abnormal findings: Secondary | ICD-10-CM | POA: Diagnosis not present

## 2022-03-21 DIAGNOSIS — I129 Hypertensive chronic kidney disease with stage 1 through stage 4 chronic kidney disease, or unspecified chronic kidney disease: Secondary | ICD-10-CM | POA: Diagnosis not present

## 2022-03-21 DIAGNOSIS — R809 Proteinuria, unspecified: Secondary | ICD-10-CM | POA: Diagnosis not present

## 2022-03-21 DIAGNOSIS — N1831 Chronic kidney disease, stage 3a: Secondary | ICD-10-CM | POA: Diagnosis not present

## 2022-03-21 DIAGNOSIS — Z79899 Other long term (current) drug therapy: Secondary | ICD-10-CM | POA: Diagnosis not present

## 2022-03-21 DIAGNOSIS — E7800 Pure hypercholesterolemia, unspecified: Secondary | ICD-10-CM | POA: Diagnosis not present

## 2022-03-21 DIAGNOSIS — E1169 Type 2 diabetes mellitus with other specified complication: Secondary | ICD-10-CM | POA: Diagnosis not present

## 2022-03-25 DIAGNOSIS — L409 Psoriasis, unspecified: Secondary | ICD-10-CM | POA: Diagnosis not present

## 2022-03-27 ENCOUNTER — Ambulatory Visit (HOSPITAL_BASED_OUTPATIENT_CLINIC_OR_DEPARTMENT_OTHER)
Admission: RE | Admit: 2022-03-27 | Discharge: 2022-03-27 | Disposition: A | Discharge status: Home / Self Care | Payer: Federal, State, Local not specified - PPO | Source: Ambulatory Visit | Attending: Physician Assistant | Admitting: Physician Assistant

## 2022-03-27 DIAGNOSIS — Z13820 Encounter for screening for osteoporosis: Secondary | ICD-10-CM | POA: Insufficient documentation

## 2022-03-27 DIAGNOSIS — Z780 Asymptomatic menopausal state: Secondary | ICD-10-CM | POA: Diagnosis not present

## 2022-05-22 DIAGNOSIS — L400 Psoriasis vulgaris: Secondary | ICD-10-CM | POA: Diagnosis not present

## 2022-05-23 DIAGNOSIS — H21233 Degeneration of iris (pigmentary), bilateral: Secondary | ICD-10-CM | POA: Diagnosis not present

## 2022-05-23 DIAGNOSIS — H5203 Hypermetropia, bilateral: Secondary | ICD-10-CM | POA: Diagnosis not present

## 2022-05-23 DIAGNOSIS — H2513 Age-related nuclear cataract, bilateral: Secondary | ICD-10-CM | POA: Diagnosis not present

## 2022-05-23 DIAGNOSIS — H04123 Dry eye syndrome of bilateral lacrimal glands: Secondary | ICD-10-CM | POA: Diagnosis not present

## 2022-05-23 DIAGNOSIS — E119 Type 2 diabetes mellitus without complications: Secondary | ICD-10-CM | POA: Diagnosis not present

## 2022-06-25 DIAGNOSIS — L409 Psoriasis, unspecified: Secondary | ICD-10-CM | POA: Diagnosis not present

## 2022-09-19 DIAGNOSIS — E7800 Pure hypercholesterolemia, unspecified: Secondary | ICD-10-CM | POA: Diagnosis not present

## 2022-09-19 DIAGNOSIS — I129 Hypertensive chronic kidney disease with stage 1 through stage 4 chronic kidney disease, or unspecified chronic kidney disease: Secondary | ICD-10-CM | POA: Diagnosis not present

## 2022-09-19 DIAGNOSIS — N1831 Chronic kidney disease, stage 3a: Secondary | ICD-10-CM | POA: Diagnosis not present

## 2022-09-19 DIAGNOSIS — E1169 Type 2 diabetes mellitus with other specified complication: Secondary | ICD-10-CM | POA: Diagnosis not present

## 2022-09-19 DIAGNOSIS — Z5181 Encounter for therapeutic drug level monitoring: Secondary | ICD-10-CM | POA: Diagnosis not present

## 2022-09-23 DIAGNOSIS — L400 Psoriasis vulgaris: Secondary | ICD-10-CM | POA: Diagnosis not present

## 2022-12-04 DIAGNOSIS — H21233 Degeneration of iris (pigmentary), bilateral: Secondary | ICD-10-CM | POA: Diagnosis not present

## 2022-12-24 DIAGNOSIS — Z01818 Encounter for other preprocedural examination: Secondary | ICD-10-CM | POA: Diagnosis not present

## 2023-01-06 DIAGNOSIS — L409 Psoriasis, unspecified: Secondary | ICD-10-CM | POA: Diagnosis not present

## 2023-01-30 DIAGNOSIS — D125 Benign neoplasm of sigmoid colon: Secondary | ICD-10-CM | POA: Diagnosis not present

## 2023-01-30 DIAGNOSIS — K5730 Diverticulosis of large intestine without perforation or abscess without bleeding: Secondary | ICD-10-CM | POA: Diagnosis not present

## 2023-01-30 DIAGNOSIS — D175 Benign lipomatous neoplasm of intra-abdominal organs: Secondary | ICD-10-CM | POA: Diagnosis not present

## 2023-01-30 DIAGNOSIS — D122 Benign neoplasm of ascending colon: Secondary | ICD-10-CM | POA: Diagnosis not present

## 2023-01-30 DIAGNOSIS — Z1211 Encounter for screening for malignant neoplasm of colon: Secondary | ICD-10-CM | POA: Diagnosis not present

## 2023-02-02 ENCOUNTER — Other Ambulatory Visit (HOSPITAL_BASED_OUTPATIENT_CLINIC_OR_DEPARTMENT_OTHER): Payer: Self-pay | Admitting: Physician Assistant

## 2023-02-02 DIAGNOSIS — Z1231 Encounter for screening mammogram for malignant neoplasm of breast: Secondary | ICD-10-CM

## 2023-03-10 ENCOUNTER — Ambulatory Visit (HOSPITAL_BASED_OUTPATIENT_CLINIC_OR_DEPARTMENT_OTHER)
Admission: RE | Admit: 2023-03-10 | Discharge: 2023-03-10 | Disposition: A | Discharge status: Home / Self Care | Payer: Federal, State, Local not specified - PPO | Source: Ambulatory Visit | Attending: Physician Assistant | Admitting: Physician Assistant

## 2023-03-10 ENCOUNTER — Encounter (HOSPITAL_BASED_OUTPATIENT_CLINIC_OR_DEPARTMENT_OTHER): Payer: Self-pay

## 2023-03-10 DIAGNOSIS — Z1231 Encounter for screening mammogram for malignant neoplasm of breast: Secondary | ICD-10-CM | POA: Insufficient documentation

## 2023-03-24 DIAGNOSIS — I129 Hypertensive chronic kidney disease with stage 1 through stage 4 chronic kidney disease, or unspecified chronic kidney disease: Secondary | ICD-10-CM | POA: Diagnosis not present

## 2023-03-24 DIAGNOSIS — R809 Proteinuria, unspecified: Secondary | ICD-10-CM | POA: Diagnosis not present

## 2023-03-24 DIAGNOSIS — E7800 Pure hypercholesterolemia, unspecified: Secondary | ICD-10-CM | POA: Diagnosis not present

## 2023-03-24 DIAGNOSIS — N1831 Chronic kidney disease, stage 3a: Secondary | ICD-10-CM | POA: Diagnosis not present

## 2023-03-24 DIAGNOSIS — E1169 Type 2 diabetes mellitus with other specified complication: Secondary | ICD-10-CM | POA: Diagnosis not present

## 2023-03-24 DIAGNOSIS — Z0000 Encounter for general adult medical examination without abnormal findings: Secondary | ICD-10-CM | POA: Diagnosis not present

## 2023-04-07 DIAGNOSIS — L409 Psoriasis, unspecified: Secondary | ICD-10-CM | POA: Diagnosis not present

## 2023-07-22 ENCOUNTER — Other Ambulatory Visit (HOSPITAL_BASED_OUTPATIENT_CLINIC_OR_DEPARTMENT_OTHER): Payer: Self-pay | Admitting: Physician Assistant

## 2023-07-22 DIAGNOSIS — Z139 Encounter for screening, unspecified: Secondary | ICD-10-CM

## 2024-03-14 ENCOUNTER — Ambulatory Visit (HOSPITAL_BASED_OUTPATIENT_CLINIC_OR_DEPARTMENT_OTHER): Payer: Federal, State, Local not specified - PPO

## 2024-03-16 ENCOUNTER — Encounter (HOSPITAL_BASED_OUTPATIENT_CLINIC_OR_DEPARTMENT_OTHER): Payer: Self-pay

## 2024-03-16 ENCOUNTER — Ambulatory Visit (HOSPITAL_BASED_OUTPATIENT_CLINIC_OR_DEPARTMENT_OTHER)
Admission: RE | Admit: 2024-03-16 | Discharge: 2024-03-16 | Disposition: A | Discharge status: Home / Self Care | Source: Ambulatory Visit | Attending: Physician Assistant | Admitting: Physician Assistant

## 2024-03-16 DIAGNOSIS — Z1231 Encounter for screening mammogram for malignant neoplasm of breast: Secondary | ICD-10-CM | POA: Diagnosis present

## 2024-03-16 DIAGNOSIS — Z139 Encounter for screening, unspecified: Secondary | ICD-10-CM

## 2024-03-31 DIAGNOSIS — Z1159 Encounter for screening for other viral diseases: Secondary | ICD-10-CM | POA: Diagnosis not present

## 2024-03-31 DIAGNOSIS — I129 Hypertensive chronic kidney disease with stage 1 through stage 4 chronic kidney disease, or unspecified chronic kidney disease: Secondary | ICD-10-CM | POA: Diagnosis not present

## 2024-03-31 DIAGNOSIS — E7800 Pure hypercholesterolemia, unspecified: Secondary | ICD-10-CM | POA: Diagnosis not present

## 2024-03-31 DIAGNOSIS — Z0000 Encounter for general adult medical examination without abnormal findings: Secondary | ICD-10-CM | POA: Diagnosis not present

## 2024-03-31 DIAGNOSIS — N1831 Chronic kidney disease, stage 3a: Secondary | ICD-10-CM | POA: Diagnosis not present

## 2024-03-31 DIAGNOSIS — E1169 Type 2 diabetes mellitus with other specified complication: Secondary | ICD-10-CM | POA: Diagnosis not present
